# Patient Record
Sex: Female | Born: 1985
Health system: Southern US, Community
[De-identification: ages and names within clinical notes are randomized; demographics above are authoritative.]

## PROBLEM LIST (undated history)

## (undated) DIAGNOSIS — Z8742 Personal history of other diseases of the female genital tract: Secondary | ICD-10-CM

## (undated) DIAGNOSIS — B977 Papillomavirus as the cause of diseases classified elsewhere: Secondary | ICD-10-CM

## (undated) DIAGNOSIS — Z8619 Personal history of other infectious and parasitic diseases: Secondary | ICD-10-CM

## (undated) DIAGNOSIS — Z87891 Personal history of nicotine dependence: Secondary | ICD-10-CM

## (undated) DIAGNOSIS — D069 Carcinoma in situ of cervix, unspecified: Secondary | ICD-10-CM

---
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
## 2007-02-25 DIAGNOSIS — G43909 Migraine, unspecified, not intractable, without status migrainosus: Secondary | ICD-10-CM | POA: Insufficient documentation

## 2007-05-30 DIAGNOSIS — Z8619 Personal history of other infectious and parasitic diseases: Secondary | ICD-10-CM

## 2007-05-30 DIAGNOSIS — Z8742 Personal history of other diseases of the female genital tract: Secondary | ICD-10-CM

## 2007-05-30 HISTORY — DX: Personal history of other diseases of the female genital tract: Z87.42

## 2007-05-30 HISTORY — DX: Personal history of other infectious and parasitic diseases: Z86.19

## 2007-06-03 ENCOUNTER — Telehealth: Payer: Self-pay | Admitting: *Deleted

## 2007-06-04 ENCOUNTER — Ambulatory Visit: Payer: Self-pay | Admitting: Family Medicine

## 2007-06-04 LAB — CONVERTED CEMR LAB
Bilirubin Urine: NEGATIVE
Glucose, Urine, Semiquant: NEGATIVE
MCV: 78.2 fL
Platelets: 252 10*3/uL
Protein, U semiquant: 30
Specific Gravity, Urine: 1.025
pH: 6

## 2007-06-10 ENCOUNTER — Ambulatory Visit: Payer: Self-pay | Admitting: Family Medicine

## 2007-06-10 ENCOUNTER — Inpatient Hospital Stay (HOSPITAL_COMMUNITY): Admission: EM | Admit: 2007-06-10 | Discharge: 2007-06-13 | Payer: Self-pay | Admitting: Emergency Medicine

## 2007-06-14 ENCOUNTER — Encounter: Payer: Self-pay | Admitting: Family Medicine

## 2007-06-17 ENCOUNTER — Encounter: Admission: RE | Admit: 2007-06-17 | Discharge: 2007-06-17 | Payer: Self-pay | Admitting: Interventional Radiology

## 2007-06-28 ENCOUNTER — Encounter: Payer: Self-pay | Admitting: Family Medicine

## 2007-06-28 ENCOUNTER — Ambulatory Visit: Payer: Self-pay | Admitting: Family Medicine

## 2007-06-28 DIAGNOSIS — N760 Acute vaginitis: Secondary | ICD-10-CM | POA: Insufficient documentation

## 2007-06-28 DIAGNOSIS — N739 Female pelvic inflammatory disease, unspecified: Secondary | ICD-10-CM | POA: Insufficient documentation

## 2007-06-28 LAB — CONVERTED CEMR LAB
Chlamydia, DNA Probe: NEGATIVE
Whiff Test: NEGATIVE

## 2007-07-01 ENCOUNTER — Telehealth: Payer: Self-pay | Admitting: *Deleted

## 2007-07-05 ENCOUNTER — Encounter: Payer: Self-pay | Admitting: Family Medicine

## 2008-11-01 ENCOUNTER — Other Ambulatory Visit: Admission: RE | Admit: 2008-11-01 | Discharge: 2008-11-01 | Payer: Self-pay | Admitting: Family Medicine

## 2008-11-01 ENCOUNTER — Ambulatory Visit: Payer: Self-pay | Admitting: Family Medicine

## 2008-11-01 ENCOUNTER — Encounter: Payer: Self-pay | Admitting: Family Medicine

## 2008-11-01 DIAGNOSIS — F32A Depression, unspecified: Secondary | ICD-10-CM | POA: Insufficient documentation

## 2008-11-01 DIAGNOSIS — F329 Major depressive disorder, single episode, unspecified: Secondary | ICD-10-CM

## 2008-11-02 LAB — CONVERTED CEMR LAB
Cholesterol: 120 mg/dL (ref 0–200)
GC Probe Amp, Genital: NEGATIVE
HDL: 56 mg/dL (ref >39)
Total CHOL/HDL Ratio: 2.1
VLDL: 15 mg/dL (ref 0–40)

## 2008-11-21 ENCOUNTER — Ambulatory Visit: Payer: Self-pay | Admitting: Family Medicine

## 2008-11-21 ENCOUNTER — Encounter: Payer: Self-pay | Admitting: Family Medicine

## 2008-12-21 IMAGING — US US ABDOMEN COMPLETE
1 series · 14 of 25 positions shown · non-contrast
Comparison: none

CLINICAL DATA: Stomach pain

ABDOMEN ULTRASOUND:
TECHNIQUE: Complete abdominal ultrasound examination was performed including
evaluation of the liver, gallbladder, bile ducts, pancreas, kidneys, spleen,
IVC, and abdominal aorta.

[Series 1: unknown · 0.34mm/px · 14 of 52 slices shown]
[im 1/52]
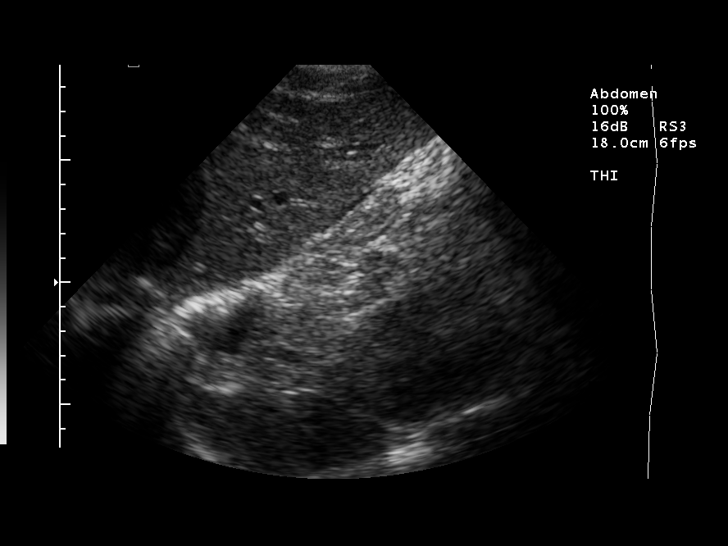
[im 5/52]
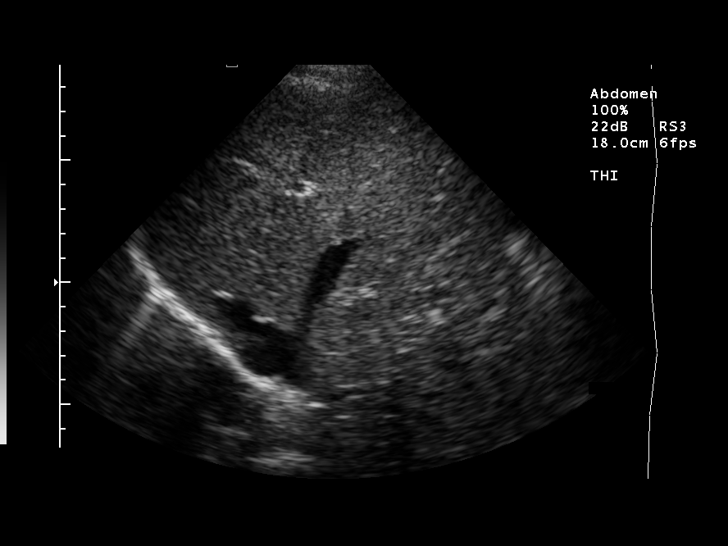
[im 9/52]
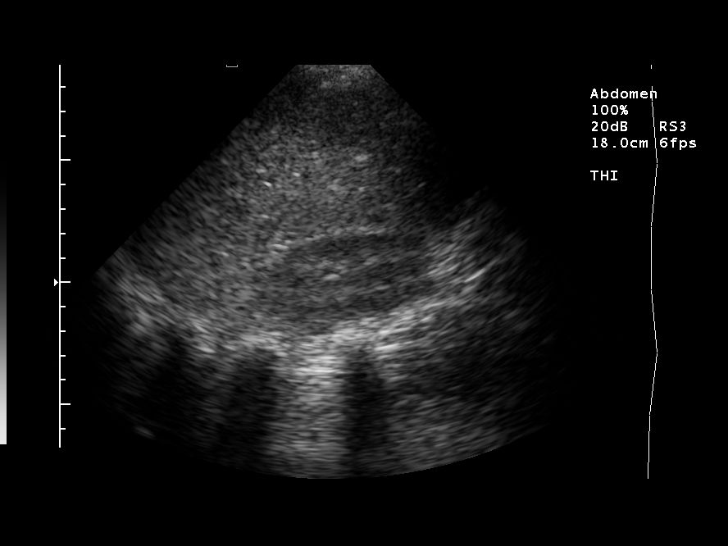
[im 13/52]
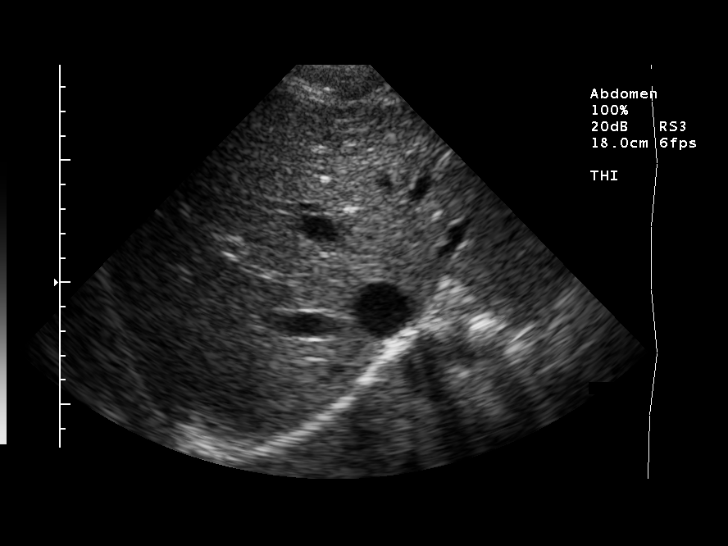
[im 18/52]
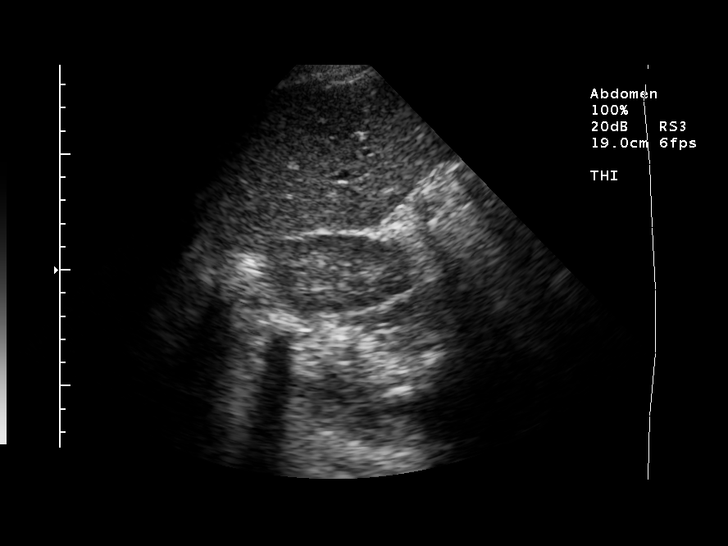
[im 20/52]
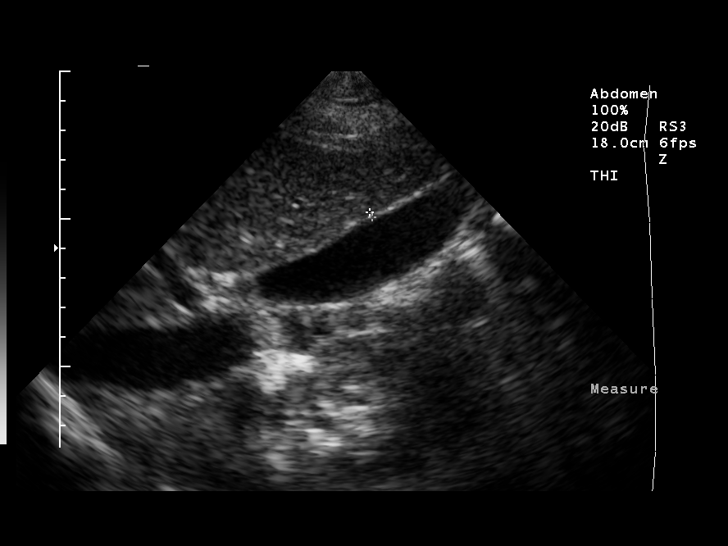
[im 24/52]
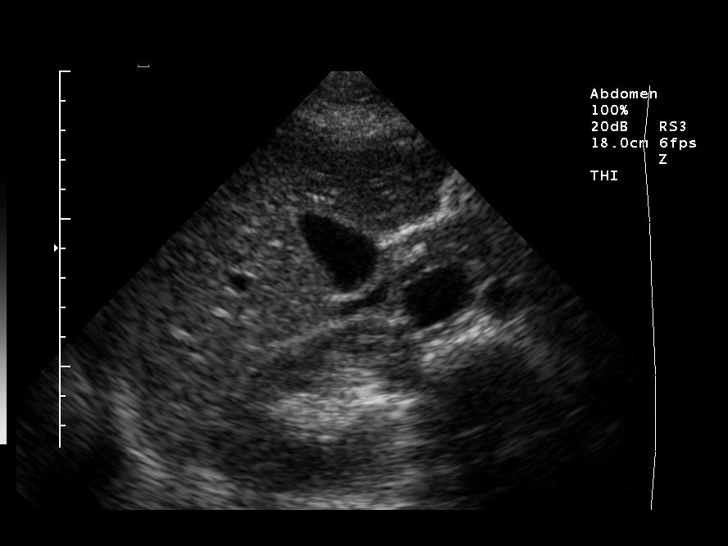
[im 28/52]
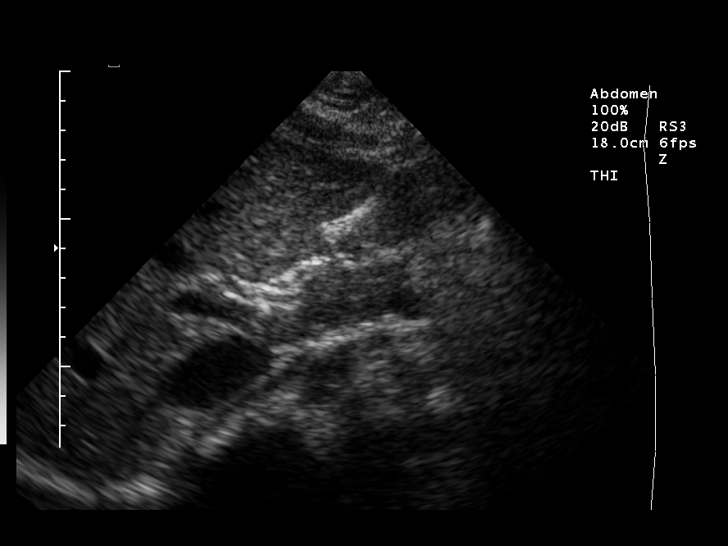
[im 32/52]
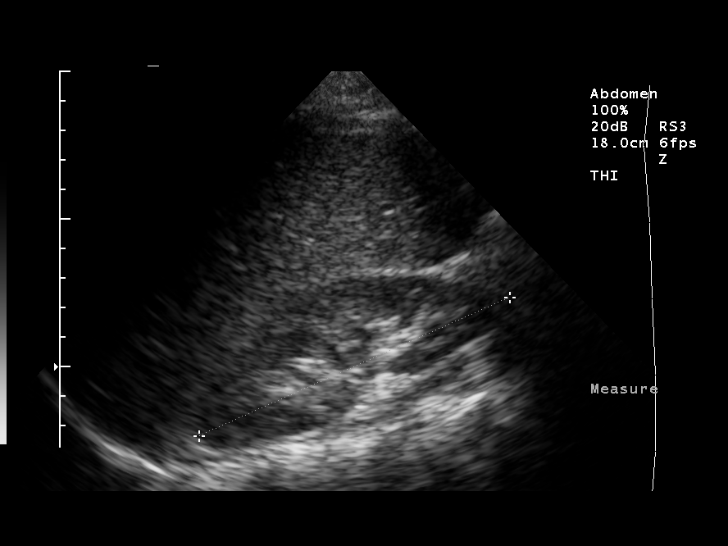
[im 35/52]
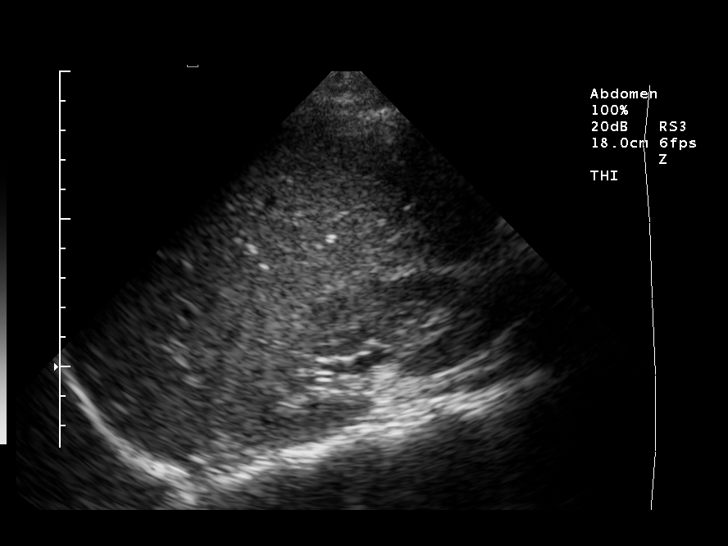
[im 39/52]
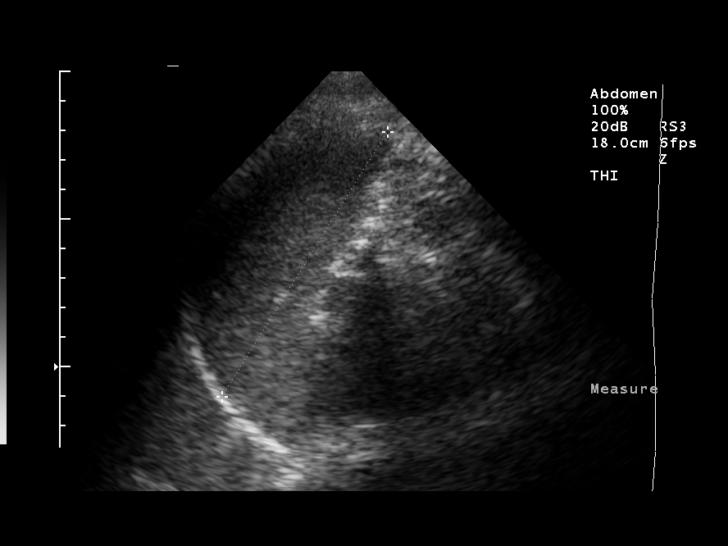
[im 43/52]
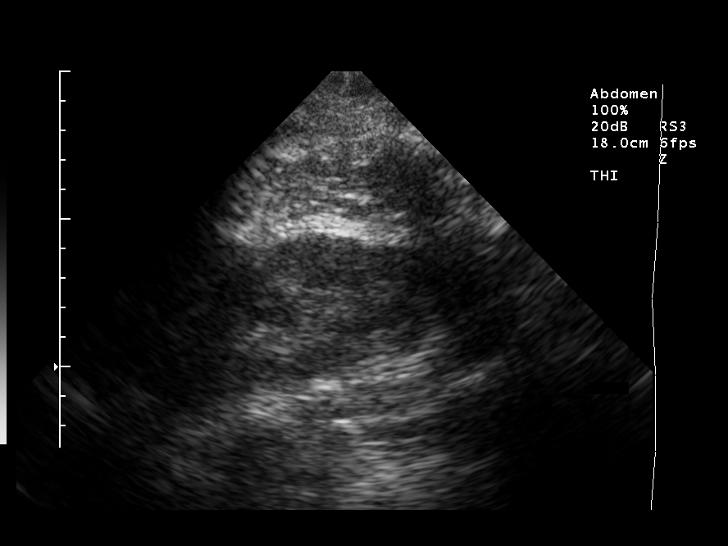
[im 47/52]
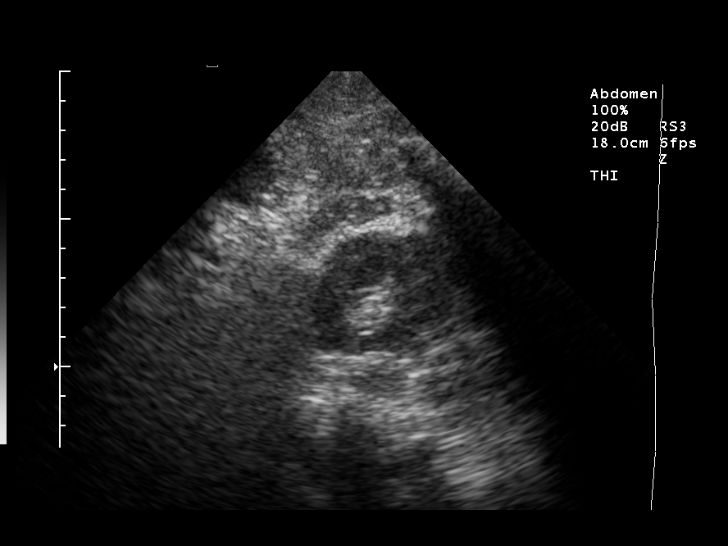
[im 52/52]
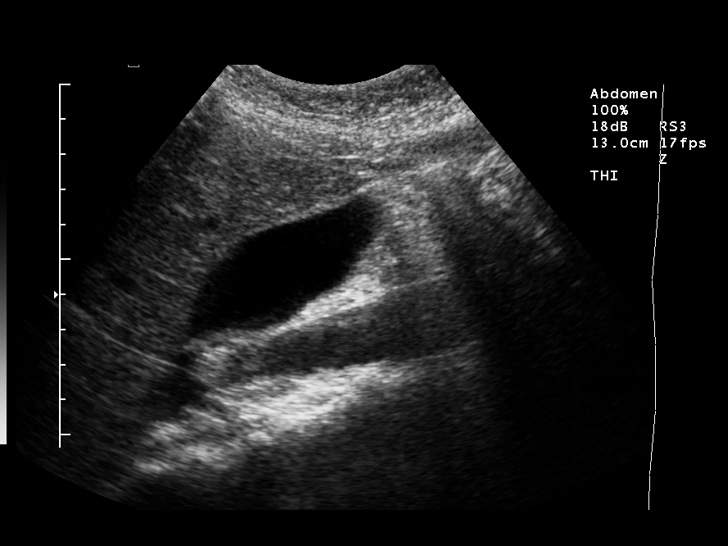

[14 of 25 positions shown; findings below may reference images not displayed]

FINDINGS: There is no evidence for gallstones or biliary ductal dilatation. 

The liver is enlarged measuring 17.07 meters. There is increased echogenicity
suggesting fatty infiltration or 

The visualized portions of the IVC and pancreas are unremarkable . 

There is no evidence for splenomegaly 

The kidneys are unremarkable, and there is no evidence of hydronephrosis.  

The abdominal aorta is nondilated.
IMPRESSION: 1. No acute findings.
2. Echogenic liver which is enlarged suggest fatty infiltration

## 2009-05-18 ENCOUNTER — Ambulatory Visit: Payer: Self-pay | Admitting: Family Medicine

## 2009-05-18 DIAGNOSIS — IMO0001 Reserved for inherently not codable concepts without codable children: Secondary | ICD-10-CM | POA: Insufficient documentation

## 2009-05-21 LAB — CONVERTED CEMR LAB
AST: 20 units/L (ref 0–37)
Albumin: 4.2 g/dL (ref 3.5–5.2)
BUN: 17 mg/dL (ref 6–23)
Calcium: 9.5 mg/dL (ref 8.4–10.5)
Chloride: 106 meq/L (ref 96–112)
Potassium: 3.4 meq/L — ABNORMAL LOW (ref 3.5–5.3)
Sodium: 142 meq/L (ref 135–145)
Total Protein: 7.3 g/dL (ref 6.0–8.3)
Vit D, 25-Hydroxy: 26 ng/mL — ABNORMAL LOW (ref 30–89)

## 2009-05-24 ENCOUNTER — Emergency Department (HOSPITAL_COMMUNITY): Admission: EM | Admit: 2009-05-24 | Discharge: 2009-05-24 | Payer: Self-pay | Admitting: Emergency Medicine

## 2009-06-11 ENCOUNTER — Ambulatory Visit: Payer: Self-pay | Admitting: Family Medicine

## 2009-06-11 ENCOUNTER — Encounter: Payer: Self-pay | Admitting: Family Medicine

## 2009-06-19 ENCOUNTER — Emergency Department (HOSPITAL_COMMUNITY): Admission: EM | Admit: 2009-06-19 | Discharge: 2009-06-19 | Payer: Self-pay | Admitting: Emergency Medicine

## 2009-09-15 ENCOUNTER — Encounter (INDEPENDENT_AMBULATORY_CARE_PROVIDER_SITE_OTHER): Payer: Self-pay | Admitting: *Deleted

## 2009-09-15 DIAGNOSIS — Z87891 Personal history of nicotine dependence: Secondary | ICD-10-CM | POA: Insufficient documentation

## 2009-09-15 HISTORY — DX: Personal history of nicotine dependence: Z87.891

## 2010-04-03 ENCOUNTER — Other Ambulatory Visit: Admission: RE | Admit: 2010-04-03 | Discharge: 2010-04-03 | Payer: Self-pay | Admitting: Family Medicine

## 2010-04-03 ENCOUNTER — Ambulatory Visit: Payer: Self-pay | Admitting: Family Medicine

## 2010-04-03 LAB — CONVERTED CEMR LAB
Chlamydia, DNA Probe: NEGATIVE
GC Probe Amp, Genital: NEGATIVE
Whiff Test: NEGATIVE

## 2010-04-05 ENCOUNTER — Telehealth: Payer: Self-pay | Admitting: Family Medicine

## 2010-04-15 ENCOUNTER — Encounter: Payer: Self-pay | Admitting: Family Medicine

## 2010-04-25 ENCOUNTER — Ambulatory Visit: Payer: Self-pay | Admitting: Family Medicine

## 2010-04-25 ENCOUNTER — Encounter (INDEPENDENT_AMBULATORY_CARE_PROVIDER_SITE_OTHER): Payer: Self-pay | Admitting: *Deleted

## 2010-04-25 DIAGNOSIS — R8789 Other abnormal findings in specimens from female genital organs: Secondary | ICD-10-CM

## 2010-04-25 DIAGNOSIS — Z8742 Personal history of other diseases of the female genital tract: Secondary | ICD-10-CM | POA: Insufficient documentation

## 2010-04-26 ENCOUNTER — Encounter: Payer: Self-pay | Admitting: Family Medicine

## 2010-05-13 ENCOUNTER — Telehealth: Payer: Self-pay | Admitting: Family Medicine

## 2010-05-13 ENCOUNTER — Ambulatory Visit: Payer: Self-pay | Admitting: Family Medicine

## 2010-05-13 DIAGNOSIS — N898 Other specified noninflammatory disorders of vagina: Secondary | ICD-10-CM | POA: Insufficient documentation

## 2011-01-28 NOTE — Progress Notes (Signed)
Summary: triage  Phone Note Call from Patient Call back at Home Phone [phone]   Caller: Patient Summary of Call: Having a discharge after having a bx on around the 25th of April. Initial call taken by: Clydell Hakim,  May 13, 2010 8:38 AM  Follow-up for Phone Call        states she did not bleed after the colposcopy but is now having a black discharge. wants to be seen. appt at 4 as she cannot get off work earlier. aware she will not be seeing her pcp Follow-up by: Golden Circle RN,  May 13, 2010 8:50 AM  Additional Follow-up for Phone Call Additional follow up Details #1::        noted and agree. Additional Follow-up by: Doralee Albino MD,  May 13, 2010 9:24 AM

## 2011-01-28 NOTE — Miscellaneous (Signed)
Summary: Consent for colposcopy  Consent for colposcopy   Imported By: Knox Royalty 06/06/2010 10:05:11  _____________________________________________________________________  External Attachment:    Type:   Image     Comment:   External Document

## 2011-01-28 NOTE — Assessment & Plan Note (Signed)
Summary: birth control & std ck,df   Vital Signs:  Patient profile:   25 year old female Height:      65.25 inches Weight:      200.8 pounds BMI:     33.28 Temp:     97.7 degrees F oral Pulse rate:   78 / minute BP sitting:   116 / 78  (left arm) Cuff size:   large  Vitals Entered By: Gladstone Pih (April 03, 2010 4:11 PM) CC: PAP, STD check Is Patient Diabetic? No Pain Assessment Patient in pain? no        CC:  PAP and STD check.  History of Present Illness: Here for regular check up, pap and STD check. Sad over recent loss of mom who died January 21, 2010  Not taking fluoxetine for some time.  Felt she did not need it.  When she did take it, the med helped.  Habits & Providers  Alcohol-Tobacco-Diet     Alcohol drinks/day: <1     Tobacco Status: current     Tobacco Counseling: to quit use of tobacco products     Cigarette Packs/Day: <0.25     Diet Comments: Eats junk food.    Exercise-Depression-Behavior     Does Patient Exercise: yes     Exercise Counseling: to improve exercise regimen     Type of exercise: treadmill     Exercise (avg: min/session): >60     Times/week: <3     Have you felt down or hopeless? yes     Have you felt little pleasure in things? yes     Depression Counseling: not indicated; screening negative for depression     STD Risk: past  Current Medications (verified): 1)  None  Allergies (verified): No Known Drug Allergies  Past History:  Past medical, surgical, family and social histories (including risk factors) reviewed, and no changes noted (except as noted below).  Family History: Reviewed history from 02/25/2007 and no changes required. lupus  Social History: Reviewed history from 02/25/2007 and no changes required. has a one year old - 3/07; lives with son; High school drop out.  Works with mentally ill and disabled. Packs/Day:  <0.25 STD Risk:  past Does Patient Exercise:  yes  Review of Systems  The patient denies anorexia,  chest pain, dyspnea on exertion, abdominal pain, abnormal bleeding, and breast masses.    Physical Exam  General:  Well-developed,well-nourished,in no acute distress; alert,appropriate and cooperative throughout examination Neck:  No deformities, masses, or tenderness noted. Lungs:  Normal respiratory effort, chest expands symmetrically. Lungs are clear to auscultation, no crackles or wheezes. Heart:  Normal rate and regular rhythm. S1 and S2 normal without gallop, murmur, click, rub or other extra sounds. Abdomen:  Bowel sounds positive,abdomen soft and non-tender without masses, organomegaly or hernias noted. Genitalia:  Normal introitus for age, no external lesions, no vaginal discharge, mucosa pink and moist, no vaginal or cervical lesions, no vaginal atrophy, no friaility or hemorrhage, normal uterus size and position, no adnexal masses or tenderness   Impression & Recommendations:  Problem # 1:  SCREENING FOR MALIGNANT NEOPLASM OF THE CERVIX (ICD-V76.2)  Orders: [facility] - Est  18-39 yrs ([address])  Problem # 2:  SEXUALLY TRANSMITTED DISEASE, EXPOSURE TO (ICD-V01.6)  Orders: HIV-[facility] (60454-09811) RPR-[facility] ([id number]) GC/Chlamydia-[facility] (87591/87491) Wet Prep- [facility] ([address]86)  Other Orders: U Preg-[facility] ([address])  Patient Instructions: 1)  I will call with lab and pap results.    Laboratory Results   Urine Tests  Date/Time  Received: April 03, 2010 4:37 PM  Date/Time Reported: April 03, 2010 4:52 PM     Urine HCG: negative Comments: ...............test performed by......Marland KitchenBonnie A. [place], MLS (ASCP)cm  Date/Time Received: April 03, 2010 4:37 PM  Date/Time Reported: April 03, 2010 4:53 PM   Wet [place] Source: vag WBC/hpf: 1-5 Bacteria/hpf: 2+  Rods Clue cells/hpf: none  Negative whiff Yeast/hpf: none Trichomonas/hpf: none Comments: ...............test performed by......Marland KitchenBonnie A. [place], MLS (ASCP)cm    Appended Document: birth control & std  ck,df     Allergies: No Known Drug Allergies   Other Orders: [facility] - Est  18-39 yrs ([address]) Pap Smear- [facility] (Pap)

## 2011-01-28 NOTE — Letter (Signed)
Summary: [facility]  [address]   [place], [place] [address]   Phone: [phone]  Fax: [phone]    04/26/2010  [facility] [address] [place], [place]  [address]  Dear Ms. Sweitzer,  As we discussed on the phone today, your biopsy confirmed the mild abnormalitiy we have been seeing on your pap smears. At your current age with these results, I would recommend a pap smear in one year. If that is abnormal, we will need to repeat the colposopy.  Do not skip a year as we do need to follow this yearly for a while.         Sincerely,   Denny Levy MD  Appended Document: COLPOSCOPYLetter mailed.

## 2011-01-28 NOTE — Letter (Signed)
Summary: Out of Work  [facility]  [address]   [place], [place] [address]   Phone: [phone]  Fax: [phone]    April 25, 2010   Employee:  [name]    To Whom It May Concern:   For Medical reasons, please excuse the above named employee from work for the following dates:  Start:   April 25, 2010  End:  April 25, 2010  If you need additional information, please feel free to contact our office.         Sincerely,    Gladstone Pih

## 2011-01-28 NOTE — Progress Notes (Signed)
Summary: Test Res  Phone Note Call from Patient Call back at Home Phone [phone]   Caller: Patient Summary of Call: Checking on test results from this week. Initial call taken by: Clydell Hakim,  April 05, 2010 1:47 PM  Follow-up for Phone Call        to PCP Follow-up by: Gladstone Pih,  April 05, 2010 1:56 PM  Additional Follow-up for Phone Call Additional follow up Details #1::        Called and informed tests showed no STDs Additional Follow-up by: Doralee Albino MD,  April 08, 2010 8:46 AM

## 2011-01-28 NOTE — Assessment & Plan Note (Signed)
Summary: colpo,tcb   Vital Signs:  Patient profile:   25 year old female Height:      65.25 inches Weight:      200.2 pounds BMI:     33.18 Temp:     98.1 degrees F oral Pulse rate:   68 / minute BP sitting:   107 / 69  (right arm) Cuff size:   large  Vitals Entered By: Gladstone Pih (April 25, 2010 8:43 AM) CC: Colpo Is Patient Diabetic? No Pain Assessment Patient in pain? no        CC:  Colpo.  History of Present Illness: LGSIL at 34 yoa with normal colpo epeat pap this year also showed LGSIl' asymptomnatic  Habits & Providers  Alcohol-Tobacco-Diet     Tobacco Status: current     Tobacco Counseling: to quit use of tobacco products     Cigarette Packs/Day: <0.25  Allergies: No Known Drug Allergies  Physical Exam  Genitalia:  normal introitus, no external lesions, no vaginal discharge, and mucosa pink and moist.   Additional Exam:  Patient given informed consent, signed copy in the chart. Placed in lithotomy position. Cervix viewed with speculum and colposcope. Was the entire squamocolumnar junction seen?yes Any acetowhite lesions noted?yes--acetowhite circumferential around os covering much of the transformation zone Any abnormalities seen with green filter?no Any abnormalities seen with application of Lugol's solution?no Was the endocervical canal sampled?no. the entire extent of the acetowhite was not seen as it extended into canal abd appeared to be same lesion so we did not sample the canal as we did biopsy Were any cervical biopsies taken?yes 8:00 Were there any complications?no COMMENTS: Patient was given post procedure instructions. We will notify her of any results.    Impression & Recommendations:  Problem # 1:  ABNORMAL PAP SMEAR, LGSIL (ICD-795.09)  Orders: Colposcopy w/ biopsy [facility] ([address]) if biopsy negative, would repeat pap in one year and continue q year pas until 3 normal yearly paps before considering change to greater screening  interval  Appended Document: colpo,tcb   Medication Administration  Medication # 1:    Medication: Ibuprofen 200mg  tab    Diagnosis: ABNORMAL PAP SMEAR, LGSIL (ICD-795.09)    Dose: 2 tablets    Route: po    Exp Date: 09/29/2011    Lot #: [id number]    Mfr: Major    Comments: Per Dr Jennette Kettle 800mg  admin    Patient tolerated medication without complications    Given by: Gladstone Pih (April 25, 2010 9:16 AM)  Medication # 2:    Medication: Ibuprofen 200mg  tab    Diagnosis: ABNORMAL PAP SMEAR, LGSIL (ICD-795.09)    Dose: 2 tablets    Route: po    Exp Date: 09/29/2011    Lot #: [id number]    Mfr: Major    Patient tolerated medication without complications    Given by: Gladstone Pih (April 25, 2010 9:16 AM)  Orders Added: 1)  Ibuprofen 200mg  tab [EMRORAL] 2)  Ibuprofen 200mg  tab [EMRORAL]

## 2011-01-28 NOTE — Assessment & Plan Note (Signed)
Summary: vag d/c/Lomas/hensel   Vital Signs:  Patient profile:   25 year old female Weight:      200 pounds Pulse rate:   83 / minute BP sitting:   117 / 73  (left arm)  Vitals Entered By: Renato Battles slade,cma CC: vag d/c x > 2 weeks after cervical biopsy. some sharp pain off and on since colpo. Is Patient Diabetic? No Pain Assessment Patient in pain? no        CC:  vag d/c x > 2 weeks after cervical biopsy. some sharp pain off and on since colpo.Marland Kitchen  History of Present Illness: CC: vag d/c  colposcopy 2 wks ago, told would have d/c for 2 wks and continuing.  Dark brown - like blood.  + odor.  No itching.  No burning, no dysuria.  No fevers/chills.  + abd pain - sharp epigastric this weekend.  using pads, goes through 3 a day.  Normally period heavy, lasts 7 days.    LMP mid April.  Sexually active, last 1 wk ago.  No contraception, + condoms.  Habits & Providers  Alcohol-Tobacco-Diet     Tobacco Status: current     Tobacco Counseling: to quit use of tobacco products  Current Medications (verified): 1)  None  Allergies (verified): No Known Drug Allergies  Past History:  Past Medical History: colposcopy 04/2010 - pending  Physical Exam  General:  Well-developed,well-nourished,in no acute distress; alert,appropriate and cooperative throughout examination Genitalia:  Normal introitus for age, no external lesions, mucosa pink and moist, no vaginal or cervical lesions, no vaginal atrophy, no friaility or hemorrhage, + black particulate discharge in clumps, one larger than rest, able to be removed.  ? Monsel's solution residual.   Impression & Recommendations:  Problem # 1:  LEUKORRHEA (ICD-623.5)  s/p colposcopy 4/28.  likely particulate discharge from dissolving monsel's clump.  advised to give 5 more days, if continued discharge, to return for f/u.   Orders: [facility]- Est Level  3 ([id number])

## 2011-02-21 ENCOUNTER — Inpatient Hospital Stay (INDEPENDENT_AMBULATORY_CARE_PROVIDER_SITE_OTHER)
Admission: RE | Admit: 2011-02-21 | Discharge: 2011-02-21 | Disposition: A | Discharge status: Home / Self Care | Payer: Medicaid Other | Source: Ambulatory Visit | Attending: Family Medicine | Admitting: Family Medicine

## 2011-02-21 DIAGNOSIS — H60399 Other infective otitis externa, unspecified ear: Secondary | ICD-10-CM

## 2011-04-07 LAB — POCT I-STAT, CHEM 8
Creatinine, Ser: 0.7 mg/dL (ref 0.4–1.2)
Glucose, Bld: 79 mg/dL (ref 70–99)
HCT: 34 % — ABNORMAL LOW (ref 36.0–46.0)
Sodium: 140 mEq/L (ref 135–145)

## 2011-04-18 ENCOUNTER — Emergency Department (HOSPITAL_COMMUNITY): Payer: Medicaid Other

## 2011-04-18 ENCOUNTER — Emergency Department (HOSPITAL_COMMUNITY)
Admission: EM | Admit: 2011-04-18 | Discharge: 2011-04-18 | Disposition: A | Discharge status: Home / Self Care | Payer: Medicaid Other | Attending: Emergency Medicine | Admitting: Emergency Medicine

## 2011-04-18 DIAGNOSIS — S6990XA Unspecified injury of unspecified wrist, hand and finger(s), initial encounter: Secondary | ICD-10-CM | POA: Insufficient documentation

## 2011-04-18 DIAGNOSIS — S6980XA Other specified injuries of unspecified wrist, hand and finger(s), initial encounter: Secondary | ICD-10-CM | POA: Insufficient documentation

## 2011-04-18 DIAGNOSIS — M79609 Pain in unspecified limb: Secondary | ICD-10-CM | POA: Insufficient documentation

## 2011-04-18 DIAGNOSIS — Y929 Unspecified place or not applicable: Secondary | ICD-10-CM | POA: Insufficient documentation

## 2011-04-18 DIAGNOSIS — F17200 Nicotine dependence, unspecified, uncomplicated: Secondary | ICD-10-CM | POA: Insufficient documentation

## 2011-05-13 NOTE — Discharge Summary (Signed)
[name], [name]              ACCOUNT NO.:  [id number]   MEDICAL RECORD NO.:  [id number]          PATIENT TYPE:  INP   LOCATION:  5713                         FACILITY:  [facility]   PHYSICIAN:  Levander Campion, M.D.  DATE OF BIRTH:  1986-06-24   DATE OF ADMISSION:  06/09/2007  DATE OF DISCHARGE:  06/13/2007                               DISCHARGE SUMMARY   PRIMARY CARE PHYSICIAN:  Dr. Leveda Ford at [facility]   DISCHARGE DIAGNOSES:  1. Tubo-ovarian abscess.  2. Anemia.  3. Chlamydia.  4. Migraine.   DISCHARGE MEDICATIONS:  1. Doxycycline 100 mg twice daily for total of a 14-day course.  2. Iron sulfate 325 mg 1 tablet twice daily, to start after      antibiotics are finished on June 25.   CONSULTS:  Interventional Radiology.   PROCEDURES:  1. A CT of the pelvis on June 10, 2007 showed:  1. Diffuse periportal      edema which is a nonspecific finding, 2. Fatty infiltration of the      liver, 3. Pericholecystic fluid in the pelvis.  1. A complex cystic      mass within the pelvis in the setting of abdominal pain and fever.      This was concerning for a tubo-ovarian abscess.  2. A transvaginal ultrasound on June 10, 2007 showed a complex cyst or      cysts associated with the right ovary that were consistent with a      collapsing hemorrhagic or an infected cyst.  3. On June 11, 2007, the patient underwent CT-guided I&D of tubo-      ovarian abscess by Interventional Radiology.   LABORATORY DATA:  Admission labs:  White blood count 8.7, hemoglobin  8.4, hematocrit 26.3, MCV is 78.4, platelet count 365, ANC of 6.7.  CMP:  Sodium 140, potassium 3.4, chloride 105, bicarb 29, glucose 86, BUN 10,  creatinine 0.62, total bilirubin 0.5, alkaline phosphatase 112, AST 32,  ALT 32, total protein 7.4, albumin 3.3, calcium 9.2.  Amylase 49, lipase  18.  Urinalysis positive for small bilirubin, otherwise negative.  Wet  prep positive for a few clue cells.   Patient's fecal occult blood  negative.  She had an INR of 1.2, a PTT of 40.  A repeat hemoglobin was  6.9 and hematocrit 20.6.   Discharge laboratories:  Lipid profile:  Cholesterol 87, triglycerides  41, HDL 30, LDL 49.  RPR was nonreactive.  Chlamydia was positive,  gonorrhea was negative.  HIV was nonreactive.  Reticulocyte count was  1.5%.  Acute hepatitis panel was negative for hepatitis B and negative  for hepatitis C.  Iron was 14, total iron binding capacity was 289,  percent saturation was 5.  Folate was 10.3, vitamin B12 was 910.  Pregnancy test was negative.  Discharge chemistries were within normal  limits.  Discharge CBC included a white blood count of 4.3, hemoglobin  of 7.1, hematocrit of 21.4 with an MCV of 77.0 and a platelet count of  342.  Culture of tubo-ovarian abscess showed no growth x3 days, final.  Blood culture was no growth x5 days, final x2.   HOSPITAL COURSE:  [name] is a 25 year old G1, P82 African-American  female who presented with a 1-week history of right upper quadrant  abdominal pain.  She also complained of dizziness but denied nausea or  vomiting.  She had just finished her menstrual period which had lasted  for 10 days instead of 7.  Her abdominal pain was rated a 10/10.  She  does endorse having a fever recently.  She denied dysuria, vaginal  discharge, new sexual partners.   By problem:  1. Abdominal pain.  Patient's abdominal pain initially had a brought      differential diagnosis, including gallbladder disease; however,      this was normal on ultrasound and LFTs were within normal limits,      pancreatitis, although amylase and lipase were normal.  Pelvic      inflammatory disease was on the differential; however, the patient      had no cervical motion tenderness and only clue cells suggestive of      bacterial vaginosis on wet prep.  Upon obtaining a CT scan of the      belly, a cystic mass associated with the right ovary was found,       concerning in the setting of fever and elevated white blood count      to be a tubo-ovarian abscess.  A transvaginal ultrasound was      consistent with CT scan findings.  Chlamydia did come back      positive.  Urine pregnancy test was negative.  The patient was      treated with cefotetan, Flagyl, and doxycycline for presumed tubo-      ovarian abscess.  The patient's pain did improve, and she remained      afebrile with a decreasing white count throughout her hospital      stay.  Interventional Radiology was consulted and the tubo-ovarian      abscess was drained successfully.  The patient is to finish a 14-      day course of doxycycline for her TOA and chlamydia.  Gonorrhea,      HIV, and RPR were all negative.  2. Anemia.  The patient had a significant drop in her baseline      hemoglobin to 6.9 at the time of admission.  Patient is anemic at      baseline with a hemoglobin of 8.4.  It is a microcytic anemia, and      this is most likely secondary to iron-deficiency anemia secondary      to menorrhagia.  Orthostatics were negative, and the patient      remained asymptomatic with the anemia throughout the hospital stay.      Iron studies was performed and the patient was found to be iron      deficient, and she was started on iron at the time of discharge to      be started after finishing the course of her antibiotics for tubo-      ovarian abscess.   FOLLOWUP:  The patient is to call to schedule a follow-up CT with  [facility] with Interventional Radiology once the patient's  drain output was less than 15 mL daily.  The patient is also to follow  up with Dr. Leveda Ford at the [facility] on July 05, 2007 at 2:15  p.m.           ______________________________  Deanna Bumps  Luz Ford, M.D.     JH/MEDQ  D:  06/17/2007  T:  06/18/2007  Job:  [id number]

## 2011-05-13 NOTE — H&P (Signed)
[name], [name]              ACCOUNT NO.:  [id number]   MEDICAL RECORD NO.:  [id number]          PATIENT TYPE:  EMS   LOCATION:  MAJO                         FACILITY:  [facility]   PHYSICIAN:  Leighton Roach McDiarmid, M.D.DATE OF BIRTH:  15-Jun-1986   DATE OF ADMISSION:  06/09/2007  DATE OF DISCHARGE:                              HISTORY & PHYSICAL   CHIEF COMPLAINT:  Abdominal pain.   HISTORY OF PRESENT ILLNESS:  A 25 year old, G1, P72, African American  female presents with a 1 week history of abdominal pain worse in the  right upper quadrant with concomitant bilateral trapezius pain.  Patient  does have dizziness, but denies any nausea or vomiting.  She has been on  her period, it started at the regular time, but had lasted for 10 days  instead of the usual 7 days.  The pain is about a 10/10 today.  She was  seen at [facility] for the same problem last Friday, but  it has worsened significantly since then.  At that time, she was  diagnosed with menstrual cramps and given ibuprofen/OCPs.  She has  progressively worsened since that time.  Her current pain did not  respond to the Tylenol/ibuprofen.  Since her last visit, her weight is  down about 3 pounds and her hemoglobin has dropped from 9.6 to 8.4.  She  had a history of low transverse C-section for her first and only child.  She is sexually active with no new partners.  She has had a fever  recently.  She has had no bowel movement x3 days, which is abnormal for  her.  She denies any dysuria/hematemesis/vaginal  discharge/dyspnea/numbness/weakness.   REVIEW OF SYSTEMS:  See HPI; otherwise, normal.   PAST MEDICAL HISTORY:  1. Migraines.  2. G1, P1.  3. Status post C-section.   ALLERGIES:  NO KNOWN DRUG ALLERGIES.   FAMILY HISTORY:  Lupus.   MEDICATIONS:  Seasonique, ibuprofen/Tylenol.   SOCIAL HISTORY:  Patient has a 22-year-old child.  She lives with her  mom.  She denies any alcohol use.  She did not finish  high school.   GENERAL:  Appears in moderate pain, well-developed, well-nourished  African American female.  HEENT:  Shows externally eyes, ears and mouth.  No thyromegaly/  lymphadenopathy of that region.  CARDIOVASCULAR:  Regular rate and rhythm with no murmurs.  PULMONARY:  Clear to auscultation bilaterally.  ABDOMEN:  Diffuse tenderness, especially in the right upper quadrant  with no hepatosplenomegaly.  Negative for psoas/obturator signs.  SKIN:  Shows no rash.  GU:  Shows normal external genitalia.  No cervical motion tenderness.  Positive yellow discharge.  Normal appearing cervix.  Normal palpation  bimanual exam.  EXTREMITIES:  Show no edema/cyanosis.  BACK:  Shows no CVA tenderness.  RECTAL:  Normal with a small amount of firm stool palpated in the rectum  and guaiac negative.   Wet prep shows negative yeast, negative trich, a few clue cells and a  few white blood cells.  Urinalysis shows some bilirubin with a specific  gravity of 1.037.  Urine pregnancy is negative.  ANC is 6.7, hemoglobin  is 8.4, white count is 8.7, platelets are 365, MCV 78.4, RDW is 14.7,  lipase is 18, amylase is 49.  Urine pregnancy negative.  Sodium 140,  potassium 3.4, chloride 105, bicarb 29, BUN 10, creatinine 0.62, glucose  86, total bili 0.5, alkaline phosphatase 112, calcium 9.2, AST 32, ALT  32, total protein 7.4, albumin 3.3.  Abdominal ultrasound shows a large  echogenic liver with question of marked fatty infiltrate.   ASSESSMENT/PLAN:  A 25 year old African American female with right upper  quadrant abdominal pain and a low grade fever.   1. Abdominal pain:  This is an unknown etiology accompanied with a      concerning physical exam, although it has improved after the      administration of morphine.  The pain is located over the liver in      the right upper quadrant mainly, but the gallbladder was normal on      ultrasound and amylase and lipase were also normal.  I am not aware       of fatty liver causing the kind of pain that she is experiencing,      especially with fever.  Although his was found on exam.      Clinically, this is not pelvic inflammatory disease with no      cervical motion tenderness and only clue cells on wet prep.  She is      fecal occult blood negative.  Constipation is certainly a      possibility, but I would not expect fever with that.  Small bowel      obstruction could present in this manner, especially with her      history of C-section and no bowel movement x3 days, although the      location of the pain is not necessarily the most common      presentation.  Appendicitis is also a possibility, but is not very      consistent with the exam.  We will check a CT without contrast of      the abdomen and pelvis.  In the meantime, we will perform pain      control with Toradol/acetaminophen/morphine.  A urine pregnancy was      negative.  2. Hypokalemia:  We will replete K-Dur 40 mEq p.o. x1.  3. Bacterial vaginitis:  Patient will get Flagyl 500 mg p.o. t.i.d. x7      days.  FGC/chlamydia/HIV/RPR pending.  4. Anemia:  I suspect this is secondary to menorrhagia.  We will check      orthostatics now and in the a.m.  We will hold on transfusion      unless obviously symptomatic.  We will consider iron sulfate      treatment, but hold for now in the setting of fever and possible      infection.  Consider iron studies, although these might not be      accurate in the setting of acute infection as well.  5. Fever:  Unknown etiology at this time.  We will await the results      of the abdominal CT.  We will hold on empiric antibiotics at this      time with her vital signs stable.  We will check a blood culture.      Her urinalysis looked very clear.  No evidence of any symptoms for      pulmonary, so we will hold on chest x-ray for  now.      Angeline Slim, M.D.    ______________________________ Leighton Roach McDiarmid, M.D.    AL/MEDQ  D:   06/10/2007  T:  06/10/2007  Job:  [id number]

## 2011-05-16 NOTE — Op Note (Signed)
[place]  Patient:    [name], [name] Visit Number: [id number] MRN: [id number]          Service Type: DSU Location: [facility] Attending Physician:  Marlowe Shores Dictated by:   Artist Pais Mina Marble, M.D. Proc. Date: 02/09/02 Admit Date:  02/09/2002                             Operative Report  PREOPERATIVE DIAGNOSIS:  Left wrist dorsal ganglion cyst.  POSTOPERATIVE DIAGNOSIS:  Left wrist dorsal ganglion cyst.  OPERATION/PROCEDURE:  Left wrist arthroscopic ganglionectomy.  SURGEON:  Artist Pais. Mina Marble, M.D.  ASSISTANT:  Junius Roads. Ireton, P.A.C.  ANESTHESIA:  General anesthesia.  TOURNIQUET TIME:  35 minutes.  COMPLICATIONS:  None.  DRAINS:  None.  DESCRIPTION OF PROCEDURE:  The patient was taken to the operating room. After the induction of adequate general anesthesia, the left upper extremity was prepped and draped in the usual sterile fashion. An Esmarch was used to exanguinate the limb and tourniquet was inflated to 250 mmHg.  At this point in time, the patients left upper extremity was placed in a concept retraction tower, well-padded, and 12 pounds of countertraction was placed across the radiocarpal joint.  At this point in time, the ganglion cyst was carefully marked out with a marking pen, as well as a standard 3-4 arthroscopic portal and a 4-5 portal.  The 4-5 portal was established after the skin was excised and blunt dissection was carried down to the capsule. The scope was introduced through the 4-5 portal.  Visualization revealed intact TFCC.  An 18-gauge needle was then used to establish a _________ outflow portal.  This was then followed by visualization of the interval between the scaphoid and lunate dorsally and the dorsal capsule where pressure on the cyst revealed the probable stalk location. At this point in time, an 18-gauge needle was used to rupture the cyst dorsally in the area of the 3-4 portal.  Once this  was done, cyst material could be seen flowing into the radiocarpal joint. This was followed by then removal of the 18-gauge needle and enlargement of the portal and introduction of a 2.9 mm suction shaver.  The ganglion stalk and part of the dorsal capsule was shaved out until extensor tendons were visualized.  The cyst was completely decompressed.  At this point in time, visualization of the rest of the joint revealed intact radial sided ligaments, intact TFCC, and intact articular cartilage throughout the entire radiocarpal joint.  The instruments were then removed. The portals were closed with 4-0 nylon. Marcaine was injected for postoperative pain control and the patient was placed in sterile dressing with Xeroform, 4x4s and a volar splint.  The patient tolerated the procedure well and was stable. Dictated by:   Artist Pais Mina Marble, M.D. Attending Physician:  Marlowe Shores DD:  02/09/02 TD:  02/09/02 Job: [id number]

## 2011-05-16 NOTE — Op Note (Signed)
NAMEALICEA, [name]              ACCOUNT NO.:  [id number]   MEDICAL RECORD NO.:  [id number]          PATIENT TYPE:  INP   LOCATION:  9147                          FACILITY:  [facility]   PHYSICIAN:  Charles A. Clearance Coots, M.D.DATE OF BIRTH:  [birth year]   DATE OF PROCEDURE:  01/30/2005  DATE OF DISCHARGE:                                 OPERATIVE REPORT   PREOPERATIVE DIAGNOSES:  Arrest of descent, thick meconium,  variable fetal  heart rate decelerations persistent.   POSTOPERATIVE DIAGNOSES:  Arrest of descent, thick meconium,  variable fetal  heart rate decelerations persistent.   PROCEDURE:  Primary low transverse cesarean section.   SURGEON:  Coral Ceo, M.D.   ANESTHESIA:  Epidural.   ESTIMATED BLOOD LOSS:  1000 mL.   IV FLUIDS:  2200 mL.   URINE OUT:  200 milliliters   COMPLICATIONS:  None.   DRAINS:  Foley to gravity.   FINDINGS:  Viable female at 22, Apgar's of 9 at 1 minutes,  9 at 5 minutes,  weight of 7 pounds and 4 ounces. Normal uterus, ovaries and fallopian tube.   DESCRIPTION OF PROCEDURE:  The patient was brought to the operating room and  after satisfactory redosing of the epidural,  the abdomen was prepped and  draped in the usual sterile fashion. A Pfannenstiel's skin incision was made  with the  scalpel that was deep and down to the fascia with the scalpel. The  fascia was nicked in the midline and the fascial incision was extended to  the left and to the right with curved Mayo scissors. The superior and  inferior fascial edges were taken off the rectus muscle both blunt, sharp  dissection. The rectus muscle was bluntly and sharply divided in the  midline.  The peritoneum was entered digitally and was digitally extended to  the left and to the right. The bladder blade was positioned and the  vesicouterine fold peritoneum above the reflection of the urinary bladder  was grasped with forceps and was incised with Metzenbaum scissors. The  incision was then  undermined and extended to the left and to the right with  Metzenbaum scissors. The bladder flap was bluntly developed and the bladder  blade was repositioned in front of the urinary bladder placing it well out  of the operative field. The uterus was then entered transversely in the  lower uterine segment with a scalpel, thick amniotic fluid was expelled. The  incision was then extended to the left and to the right digitally. The  vertex was then delivered with the aid of fundal pressure from the  assistant, the infant's mouth and nose was suctioned with a suction bulb and  the delivery was completed with the aid of fundal pressure from the  assistant. The umbilical cord was doubly clamped and cut and the infant was  handed off to the nursery staff. Cord pH and cord blood were obtained.  Placenta was then manually removed from the uterus intact. The endometrial  surface was thoroughly debrided with a dry lap sponge. The edges of the  uterine incision were grasped with ring forceps  and the uterus was closed  with continuous interlocking suture of #0 Monocryl in the first layer, the  second layer was closed with a continuous imbricating suture of #0 Monocryl.  Hemostasis was excellent. The pelvic cavity was thoroughly irrigated with  warm saline solution and all clots were removed. The abdomen was then closed  as follows:  The peritoneum was closed with a continuous suture, 2-0  Monocryl, the fascia was closed with continuous suture of #0 PDS, the  subcutaneous tissue was thoroughly irrigated with warm saline solution and  all areas of subcutaneous bleeding were coagulated with the Bovie.  The skin  was then closed with continuous subcuticular suture of 3-0 Monocryl. A  sterile bandage was applied to the incision for closure. The surgical  technician indicated that all needle, sponge and instrument counts were  correct. The patient tolerated procedure well and was transported to the  recovery  room in satisfactory addition.      CAH/MEDQ  D:  01/30/2005  T:  01/30/2005  Job:  [id number]

## 2011-05-16 NOTE — Discharge Summary (Signed)
NAMECHENISE, [name]              ACCOUNT NO.:  [id number]   MEDICAL RECORD NO.:  [id number]          PATIENT TYPE:  INP   LOCATION:  9147                          FACILITY:  [facility]   PHYSICIAN:  Charles A. Clearance Coots, M.D.DATE OF BIRTH:  Jun 19, 1986   DATE OF ADMISSION:  01/30/2005  DATE OF DISCHARGE:  02/02/2005                                 DISCHARGE SUMMARY   ADMISSION DIAGNOSES:  1.  [redacted] weeks gestation.  2.  Active labor.   DISCHARGE DIAGNOSES:  1.  [redacted] weeks gestation.  2.  Active labor.  3.  Status post primary low transverse cesarean section for arrest of      descent, thick meconium, variable fetal heart rate decelerations.  4.  Status post delivery on January 30, 2005, of a viable female at 1838,      Apgars of 9 at one minute and 9 at five minutes, weight of 7 pounds 4      ounces, and cord pH of 7.29.  5.  Mother and infant discharged home in good condition.   REASON FOR ADMISSION:  A 25 year old black female, gravida 1, with estimated  date of confinement of January 29, 2005, presented with strong uterine  contractions.  Denied rupture of membranes, fever, chills, or dysuria.  Prenatal care was uncomplicated.  Group B Strep culture was negative.   PAST MEDICAL HISTORY:  Surgeries; tonsils and wrist surgery.  Illnesses;  none.   MEDICATIONS:  Prenatal vitamins.   ALLERGIES:  No known drug allergies.   SOCIAL HISTORY:  Adolescent.  Negative tobacco, alcohol, or recreational  drug use.   PHYSICAL EXAMINATION:  GENERAL:  Slightly obese black female in no acute  distress.  VITAL SIGNS:  Temperature 98, pulse 95, respiratory rate 20, blood pressure  145/83.  LUNGS:  Clear to auscultation bilaterally.  HEART:  Regular rate and rhythm.  ABDOMEN:  Gravid and nontender.  PELVIC:  Cervix 5 cm dilated, 80% effaced, and vertex at -2 station.   LABORATORY DATA:  Hemoglobin 11.2, hematocrit 31, white blood cell count  7500, platelets 208,000.   HOSPITAL COURSE:  The patient  was admitted and membranes were ruptured at  1600 revealing thick meconium.  The patient was having moderate variable  decelerations at the time and a decision was made to amnioinfuse for thick  meconium and moderate variable decelerations.  The patient continued to have  persistent variable fetal heart rate decelerations.  The intrauterine  pressure catheter which had been inserted for amnioinfusion also revealed  adequate contractility for progression of labor.  However, over the next  five hours, the patient had no cervical change or descent of the vertex and  because of thick meconium and persistent variable fetal heart rate  decelerations, the decision was made to proceed with cesarean section  delivery for arrest of descent, thick meconium, and variable fetal heart  rate decelerations.  Primary low transverse cesarean section was performed  on January 30, 2005.  A viable female was delivered at 1838 with Apgars of 9  at one minute and 9 at five minutes, weight of 7 pounds 4  ounces, cord pH of  7.29.  Postoperative course was uncomplicated except for postoperative  anemia because of uterine atony during surgery, but there were no  hemodynamic changes orthostatically.  The patient tolerated the decrease in  hemoglobin quite well and had no symptoms.  Transfusion was therefore not  necessary.  The patient's postoperative course continued to be uncomplicated  and she was discharged home on postoperative day #3 in good condition.   DISCHARGE LABORATORY DATA:  Hemoglobin 6.8, hematocrit 19.6, white blood  cell count 12,600, platelets 181,000.   DISPOSITION:  Ibuprofen and Tylox were prescribed for pain.  Iron was  prescribed for anemia in the form of Niferex 150 Forte.  The patient is to  continue taking prenatal vitamins.  Routine written instructions per booklet  for obstetrical discharge was given to the patient.  She is to call our  office for a follow-up appointment in two  weeks.      CAH/MEDQ  D:  02/02/2005  T:  02/03/2005  Job:  [id number]

## 2011-10-16 LAB — CBC
HCT: 21.4 — ABNORMAL LOW
HCT: 26.3 — ABNORMAL LOW
Hemoglobin: 6.9 — CL
Hemoglobin: 7.1 — CL
MCHC: 32.2
MCHC: 32.2
MCHC: 32.5
MCHC: 33.1
MCHC: 33.5
MCV: 77 — ABNORMAL LOW
MCV: 78
MCV: 78.4
Platelets: 296
Platelets: 307
Platelets: 365
RBC: 2.74 — ABNORMAL LOW
RBC: 2.78 — ABNORMAL LOW
RDW: 14.1 — ABNORMAL HIGH
RDW: 14.3 — ABNORMAL HIGH
RDW: 14.4 — ABNORMAL HIGH
WBC: 4.3
WBC: 8.7

## 2011-10-16 LAB — BASIC METABOLIC PANEL
BUN: 9
CO2: 26
CO2: 28
Calcium: 8.4
Calcium: 8.7
Chloride: 105
Creatinine, Ser: 0.61
GFR calc Af Amer: >60
Glucose, Bld: 85
Sodium: 138

## 2011-10-16 LAB — URINALYSIS, ROUTINE W REFLEX MICROSCOPIC
Nitrite: NEGATIVE
Specific Gravity, Urine: 1.037 — ABNORMAL HIGH
pH: 6

## 2011-10-16 LAB — CULTURE, ROUTINE-ABSCESS: Culture: NO GROWTH

## 2011-10-16 LAB — COMPREHENSIVE METABOLIC PANEL
Albumin: 3.3 — ABNORMAL LOW
BUN: 10
CO2: 29
Calcium: 9.2
Chloride: 105
Creatinine, Ser: 0.62
GFR calc non Af Amer: >60
Total Bilirubin: 0.5

## 2011-10-16 LAB — GC/CHLAMYDIA PROBE AMP, GENITAL
Chlamydia, DNA Probe: POSITIVE — AB
GC Probe Amp, Genital: NEGATIVE

## 2011-10-16 LAB — CROSSMATCH: ABO/RH(D): AB POS

## 2011-10-16 LAB — GC/CHLAMYDIA PROBE AMP, URINE: Chlamydia, Swab/Urine, PCR: POSITIVE — AB

## 2011-10-16 LAB — LIPID PANEL
Cholesterol: 87
LDL Cholesterol: 49
VLDL: 8

## 2011-10-16 LAB — WET PREP, GENITAL: Yeast Wet Prep HPF POC: NONE SEEN

## 2011-10-16 LAB — ABO/RH: ABO/RH(D): AB POS

## 2011-10-16 LAB — CULTURE, BLOOD (ROUTINE X 2): Culture: NO GROWTH

## 2011-10-16 LAB — FOLATE: Folate: 10.3

## 2011-10-16 LAB — APTT: aPTT: 40 — ABNORMAL HIGH

## 2011-10-16 LAB — DIFFERENTIAL
Basophils Absolute: 0
Lymphocytes Relative: 16
Neutro Abs: 6.7

## 2011-10-16 LAB — HEPATITIS PANEL, ACUTE
Hep B C IgM: NEGATIVE
Hepatitis B Surface Ag: NEGATIVE

## 2011-10-16 LAB — LIPASE, BLOOD: Lipase: 18

## 2012-06-02 ENCOUNTER — Encounter: Payer: Medicaid Other | Admitting: Family Medicine

## 2012-08-22 ENCOUNTER — Emergency Department (HOSPITAL_COMMUNITY)
Admission: EM | Admit: 2012-08-22 | Discharge: 2012-08-22 | Disposition: A | Discharge status: Home / Self Care | Payer: No Typology Code available for payment source | Attending: Emergency Medicine | Admitting: Emergency Medicine

## 2012-08-22 ENCOUNTER — Encounter (HOSPITAL_COMMUNITY): Payer: Self-pay | Admitting: Emergency Medicine

## 2012-08-22 ENCOUNTER — Emergency Department (HOSPITAL_COMMUNITY): Payer: No Typology Code available for payment source

## 2012-08-22 DIAGNOSIS — M542 Cervicalgia: Secondary | ICD-10-CM | POA: Insufficient documentation

## 2012-08-22 DIAGNOSIS — M25519 Pain in unspecified shoulder: Secondary | ICD-10-CM | POA: Insufficient documentation

## 2012-08-22 MED ORDER — IBUPROFEN 600 MG PO TABS: 600.0000 mg | ORAL_TABLET | Freq: Four times a day (QID) | ORAL | Status: AC | PRN

## 2012-08-22 MED ORDER — IBUPROFEN 800 MG PO TABS
800.0000 mg | ORAL_TABLET | Freq: Once | ORAL | Status: AC
  Administered 2012-08-22: 800 mg via ORAL
  Filled 2012-08-22: qty 1
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
## 2012-12-06 ENCOUNTER — Emergency Department (HOSPITAL_COMMUNITY): Payer: No Typology Code available for payment source

## 2012-12-06 ENCOUNTER — Emergency Department (HOSPITAL_COMMUNITY)
Admission: EM | Admit: 2012-12-06 | Discharge: 2012-12-06 | Disposition: A | Discharge status: Home / Self Care | Payer: No Typology Code available for payment source | Attending: Emergency Medicine | Admitting: Emergency Medicine

## 2012-12-06 ENCOUNTER — Encounter (HOSPITAL_COMMUNITY): Payer: Self-pay | Admitting: *Deleted

## 2012-12-06 DIAGNOSIS — F17200 Nicotine dependence, unspecified, uncomplicated: Secondary | ICD-10-CM | POA: Insufficient documentation

## 2012-12-06 DIAGNOSIS — Y9389 Activity, other specified: Secondary | ICD-10-CM | POA: Insufficient documentation

## 2012-12-06 DIAGNOSIS — IMO0002 Reserved for concepts with insufficient information to code with codable children: Secondary | ICD-10-CM | POA: Insufficient documentation

## 2012-12-06 DIAGNOSIS — I020 Rheumatic chorea with heart involvement: Secondary | ICD-10-CM | POA: Insufficient documentation

## 2012-12-06 DIAGNOSIS — T148XXA Other injury of unspecified body region, initial encounter: Secondary | ICD-10-CM

## 2012-12-06 DIAGNOSIS — Y92410 Unspecified street and highway as the place of occurrence of the external cause: Secondary | ICD-10-CM | POA: Insufficient documentation

## 2012-12-06 MED ORDER — IBUPROFEN 600 MG PO TABS: 600.0000 mg | ORAL_TABLET | Freq: Four times a day (QID) | ORAL | Status: DC | PRN

## 2012-12-06 MED ORDER — IBUPROFEN 400 MG PO TABS
600.0000 mg | ORAL_TABLET | Freq: Once | ORAL | Status: AC
  Administered 2012-12-06: 600 mg via ORAL
  Filled 2012-12-06: qty 1

## 2012-12-06 MED ORDER — HYDROCODONE-ACETAMINOPHEN 5-325 MG PO TABS: 1.0000 | ORAL_TABLET | Freq: Four times a day (QID) | ORAL | Status: DC | PRN
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
## 2013-08-03 ENCOUNTER — Encounter (HOSPITAL_COMMUNITY): Payer: Self-pay | Admitting: Emergency Medicine

## 2013-08-03 ENCOUNTER — Emergency Department (HOSPITAL_COMMUNITY)
Admission: EM | Admit: 2013-08-03 | Discharge: 2013-08-03 | Disposition: A | Discharge status: Home / Self Care | Payer: Self-pay | Attending: Emergency Medicine | Admitting: Emergency Medicine

## 2013-08-03 DIAGNOSIS — IMO0002 Reserved for concepts with insufficient information to code with codable children: Secondary | ICD-10-CM | POA: Insufficient documentation

## 2013-08-03 DIAGNOSIS — Y939 Activity, unspecified: Secondary | ICD-10-CM | POA: Insufficient documentation

## 2013-08-03 DIAGNOSIS — T63461A Toxic effect of venom of wasps, accidental (unintentional), initial encounter: Secondary | ICD-10-CM | POA: Insufficient documentation

## 2013-08-03 DIAGNOSIS — L03113 Cellulitis of right upper limb: Secondary | ICD-10-CM

## 2013-08-03 DIAGNOSIS — T6391XA Toxic effect of contact with unspecified venomous animal, accidental (unintentional), initial encounter: Secondary | ICD-10-CM | POA: Insufficient documentation

## 2013-08-03 DIAGNOSIS — F17200 Nicotine dependence, unspecified, uncomplicated: Secondary | ICD-10-CM | POA: Insufficient documentation

## 2013-08-03 DIAGNOSIS — Y929 Unspecified place or not applicable: Secondary | ICD-10-CM | POA: Insufficient documentation

## 2013-08-03 MED ORDER — CEPHALEXIN 500 MG PO CAPS: 500.0000 mg | ORAL_CAPSULE | Freq: Four times a day (QID) | ORAL | Status: DC

## 2013-08-03 MED ORDER — SULFAMETHOXAZOLE-TRIMETHOPRIM 800-160 MG PO TABS: 1.0000 | ORAL_TABLET | Freq: Two times a day (BID) | ORAL | Status: DC

## 2013-08-03 NOTE — ED Notes (Signed)
PT. STUNG BY A BEE Saturday AT RIGHT UPPER ARM WITH PAIN / ITCHING UNRELIEVED BY OTC BENADRYL , RESPIRATIONS UNLABORED / AIRWAY INTACT .

## 2013-08-03 NOTE — ED Provider Notes (Signed)
History  This chart was scribed for non-physician practitioner, Fayrene Helper PA-C, working with Juliet Rude. Rubin Payor, MD by Ardeen Jourdain, ED Scribe. This patient was seen in room [facility] and the patient's care was started at 2201.  CSN: [id number]     Arrival date & time 08/03/13  2039   First MD Initiated Contact with Patient 08/03/13 2201     Chief Complaint  Patient presents with  . Insect Bite    The history is provided by the patient. No language interpreter was used.    HPI Comments: [name] is a 27 y.o. female who presents to the Emergency Department complaining of a bee sting to the right upper arm that occurred 5 days ago. Pt is complaining of pain and itching around the sting. She reports taking benadryl with no relief. She admits to frequently itching the area. She denies any trouble breathing, fever, nausea, emesis or rash as associated symptoms. She denies any allergies to bee stings.    History reviewed. No pertinent past medical history. History reviewed. No pertinent past surgical history. No family history on file.  History  Substance Use Topics  . Smoking status: Current Every Day Smoker -- 0.50 packs/day  . Smokeless tobacco: Not on file  . Alcohol Use: No   No OB history available.  Review of Systems  Skin:       Insect bite  All other systems reviewed and are negative.    Allergies  Review of patient's allergies indicates no known allergies.  Home Medications   Current Outpatient Rx  Name  Route  Sig  Dispense  Refill  . diphenhydrAMINE (BENADRYL) 25 mg capsule   Oral   Take 25 mg by mouth daily as needed for itching or allergies.         . cephALEXin (KEFLEX) 500 MG capsule   Oral   Take 1 capsule (500 mg total) by mouth 4 (four) times daily.   28 capsule   0   . sulfamethoxazole-trimethoprim (BACTRIM DS,SEPTRA DS) 800-160 MG per tablet   Oral   Take 1 tablet by mouth 2 (two) times daily.   6 tablet   0    Triage Vitals:  BP 117/72  Pulse 84  Temp(Src) 98.4 F (36.9 C) (Oral)  Resp 20  SpO2 100%  LMP 07/03/2013 Physical Exam  Nursing note and vitals reviewed. Constitutional: She is oriented to person, place, and time. She appears well-developed and well-nourished. No distress.  HENT:  Head: Normocephalic and atraumatic.  Eyes: EOM are normal. Pupils are equal, round, and reactive to light.  Neck: Normal range of motion. Neck supple. No tracheal deviation present.  Cardiovascular: Normal rate, regular rhythm and normal heart sounds.  Exam reveals no gallop and no friction rub.   No murmur heard. Pulmonary/Chest: Effort normal and breath sounds normal. No respiratory distress. She has no wheezes. She has no rales. She exhibits no tenderness.  Abdominal: Soft. She exhibits no distension.  Musculoskeletal: Normal range of motion. She exhibits no edema.  Neurological: She is alert and oriented to person, place, and time.  Skin: Skin is warm and dry.  3 by 5 inch area of moderate erythema and warmth noted to the medial aspect of right upper arm. No induration or fluctuance. No stinger present.   Psychiatric: She has a normal mood and affect. Her behavior is normal.    ED Course   Procedures (including critical care time)  DIAGNOSTIC STUDIES: Oxygen Saturation is 100% on room  air, normal by my interpretation.    COORDINATION OF CARE:  10:21 PM-Discussed treatment plan which includes antibiotics and instructions for home care with pt at bedside and pt agreed to plan.    Labs Reviewed - No data to display No results found. 1. Cellulitis of right upper arm   2. Bee sting reaction, initial encounter     MDM  BP 117/72  Pulse 84  Temp(Src) 98.4 F (36.9 C) (Oral)  Resp 20  SpO2 100%  LMP 07/03/2013   I personally performed the services described in this documentation, which was scribed in my presence. The recorded information has been reviewed and is accurate.     Fayrene Helper,  PA-C 08/03/13 2224

## 2013-08-04 NOTE — ED Provider Notes (Signed)
Medical screening examination/treatment/procedure(s) were performed by non-physician practitioner and as supervising physician I was immediately available for consultation/collaboration.  Levent Kornegay R. Sydelle Sherfield, MD 08/04/13 2335 

## 2014-05-29 DIAGNOSIS — Z8619 Personal history of other infectious and parasitic diseases: Secondary | ICD-10-CM

## 2014-05-29 HISTORY — DX: Personal history of other infectious and parasitic diseases: Z86.19

## 2014-06-02 ENCOUNTER — Encounter: Payer: Self-pay | Admitting: Family Medicine

## 2014-06-02 ENCOUNTER — Ambulatory Visit (INDEPENDENT_AMBULATORY_CARE_PROVIDER_SITE_OTHER): Payer: No Typology Code available for payment source | Admitting: Family Medicine

## 2014-06-02 ENCOUNTER — Other Ambulatory Visit (HOSPITAL_COMMUNITY)
Admission: RE | Admit: 2014-06-02 | Discharge: 2014-06-02 | Disposition: A | Discharge status: Home / Self Care | Payer: No Typology Code available for payment source | Source: Ambulatory Visit | Attending: Family Medicine | Admitting: Family Medicine

## 2014-06-02 VITALS — BP 122/87 | HR 69 | Temp 98.7°F | Ht 65.25 in | Wt 185.5 lb

## 2014-06-02 DIAGNOSIS — Z1151 Encounter for screening for human papillomavirus (HPV): Secondary | ICD-10-CM | POA: Insufficient documentation

## 2014-06-02 DIAGNOSIS — Z124 Encounter for screening for malignant neoplasm of cervix: Secondary | ICD-10-CM

## 2014-06-02 DIAGNOSIS — Z0142 Encounter for cervical smear to confirm findings of recent normal smear following initial abnormal smear: Secondary | ICD-10-CM | POA: Insufficient documentation

## 2014-06-02 DIAGNOSIS — Z01419 Encounter for gynecological examination (general) (routine) without abnormal findings: Secondary | ICD-10-CM | POA: Insufficient documentation

## 2014-06-02 DIAGNOSIS — Z309 Encounter for contraceptive management, unspecified: Secondary | ICD-10-CM | POA: Insufficient documentation

## 2014-06-02 DIAGNOSIS — R87810 Cervical high risk human papillomavirus (HPV) DNA test positive: Secondary | ICD-10-CM | POA: Insufficient documentation

## 2014-06-02 DIAGNOSIS — A5901 Trichomonal vulvovaginitis: Secondary | ICD-10-CM

## 2014-06-02 DIAGNOSIS — N760 Acute vaginitis: Secondary | ICD-10-CM

## 2014-06-02 LAB — POCT WET PREP (WET MOUNT): Clue Cells Wet Prep Whiff POC: POSITIVE

## 2014-06-02 MED ORDER — METRONIDAZOLE 500 MG PO TABS: 500.0000 mg | ORAL_TABLET | Freq: Three times a day (TID) | ORAL | Status: DC

## 2014-06-02 NOTE — Patient Instructions (Signed)
You have a trich infection.  I sent in a prescription for you to treat it.  Your partner should go to the doctor and also get treated. I recommend an IUD for birth control.  Please make an appointment on the way out for our [facility] clinic for an IUD insertion. It helps to take 3 ibuprofen 30-60 minutes before you have the IUD. No alcohol when on the antibiotic.

## 2014-06-02 NOTE — Assessment & Plan Note (Signed)
Pap done

## 2014-06-02 NOTE — Assessment & Plan Note (Addendum)
Return for IUD insertion after clearing vag infection  Needs pregnancy test x 2 1 week apart.

## 2014-06-02 NOTE — Assessment & Plan Note (Signed)
Will treat and instructed partner also needs treatment. Await GC and chlamydia results.

## 2014-06-02 NOTE — Progress Notes (Signed)
   Subjective:    Patient ID: [name], female    DOB: 10-Jul-1986, 28 y.o.   MRN: [id number]  HPI Not currently using contraception.  Does not want pills or shots.  After discussion, would use IUD. No pain or vag discharge. Needs pap to follow up previous abnormal.   Review of Systems     Objective:   Physical Exam Vag discharge, wet prep taken Pap done. No bimanual tenderness.        Assessment & Plan:

## 2014-06-05 LAB — CYTOLOGY - PAP

## 2014-06-05 NOTE — Addendum Note (Signed)
Addended by: Moses Manners on: 06/05/2014 04:55 PM   Modules accepted: Orders

## 2014-06-09 ENCOUNTER — Other Ambulatory Visit: Payer: Self-pay | Admitting: Family Medicine

## 2014-06-09 MED ORDER — TINIDAZOLE 500 MG PO TABS: 2.0000 g | ORAL_TABLET | Freq: Once | ORAL | Status: DC

## 2014-06-09 NOTE — Telephone Encounter (Signed)
Patient called in. Unable to tolerate flagyl. D/c flagyl. Tinidazole 2 gm once.

## 2014-06-09 NOTE — Telephone Encounter (Signed)
Pt called stating should could not tolerate the flagyl.  Every time she takes one pill she vomits even after she eat something.  Precepted with Dr. Armen PickupFunches.  Medication changed to tinidazole 2 gm.  Clovis PuMartin, Della Homan L, RN

## 2014-06-14 ENCOUNTER — Ambulatory Visit (INDEPENDENT_AMBULATORY_CARE_PROVIDER_SITE_OTHER): Payer: No Typology Code available for payment source | Admitting: Family Medicine

## 2014-06-14 ENCOUNTER — Encounter: Payer: Self-pay | Admitting: Family Medicine

## 2014-06-14 VITALS — BP 122/86 | HR 78 | Ht 65.25 in | Wt 187.0 lb

## 2014-06-14 DIAGNOSIS — A5901 Trichomonal vulvovaginitis: Secondary | ICD-10-CM

## 2014-06-20 NOTE — Progress Notes (Signed)
Family Medicine Office Visit Note   Subjective:   Patient ID: [name], female  DOB: 07/08/1986, 28 y.o.. MRN: [id number]   Pt that comes today to follow up recent diagnosis with Trichomonas. She did not tolerate metronidazole but was able to complete tinidazole course. She denies vaginal discharge or other symptoms. She was asymptomatic before diagnosis and continues to be.   Review of Systems:  Pt denies fever, chills, nausea, vomiting. No changes on urinary or BM habits. No skin rashes or joint pain/symptoms.  Objective:   Physical Exam: Gen:  NAD HEENT: Moist mucous membranes  CV: Regular rate and rhythm, no murmurs rubs or gallops PULM: Clear to auscultation bilaterally. No wheezes/rales/rhonchi ABD: Soft, non tender, non distended, normal bowel sounds EXT: No edema Neuro: Alert and oriented x3. No focalization  Assessment & Plan:

## 2014-06-20 NOTE — Assessment & Plan Note (Signed)
Came for TOC. Since she was asymptomatic prior diagnosis and continues to be with full course of Tinidazole, TOC is unnecessary. Pt reports partner was treated as well. Discussed signs that should prompt re-evaluation.

## 2014-06-20 NOTE — Patient Instructions (Signed)
You don't need to be rechecked for test of cure if you don't have symptoms and completed the full course of Tinidazole as prescribed.

## 2014-07-06 ENCOUNTER — Ambulatory Visit: Payer: No Typology Code available for payment source

## 2014-07-31 ENCOUNTER — Encounter: Payer: Self-pay | Admitting: Family Medicine

## 2014-07-31 ENCOUNTER — Ambulatory Visit (INDEPENDENT_AMBULATORY_CARE_PROVIDER_SITE_OTHER): Payer: No Typology Code available for payment source | Admitting: Family Medicine

## 2014-07-31 VITALS — BP 122/79 | HR 80 | Temp 98.2°F | Ht 65.0 in | Wt 189.0 lb

## 2014-07-31 DIAGNOSIS — N898 Other specified noninflammatory disorders of vagina: Secondary | ICD-10-CM

## 2014-07-31 DIAGNOSIS — B373 Candidiasis of vulva and vagina: Secondary | ICD-10-CM

## 2014-07-31 DIAGNOSIS — B3731 Acute candidiasis of vulva and vagina: Secondary | ICD-10-CM

## 2014-07-31 LAB — POCT WET PREP (WET MOUNT): Clue Cells Wet Prep Whiff POC: NEGATIVE

## 2014-07-31 MED ORDER — FLUCONAZOLE 150 MG PO TABS: 150.0000 mg | ORAL_TABLET | Freq: Once | ORAL | Status: DC

## 2014-07-31 NOTE — Assessment & Plan Note (Addendum)
History and pelvic exam consistent with vaginal yeast infection - Recent antibiotic course (for BV / Trich)  Plan: 1. Wet Prep - confirmed +many yeast (hyphae / spores), neg trich, neg whiff, few clue - Pt waited for results, discussed at time of visit 2. Diflucan 150mg  PO x 2 tabs (Day 1 and Day 3 if unresolved) 3. RTC PRN

## 2014-07-31 NOTE — Patient Instructions (Addendum)
Dear [name], Thank you for coming in to clinic today.  Today we discussed your Vaginal Itching. 1. Our swab test here in the lab showed a large amount of Yeast - consistent with a yeast infection. 2. There was no evidence of Bacterial Vaginosis or Trich infection 3. Sent prescription for Diflucan 150mg  tabs, take 1 today, and then take the second tablet on Day 3 if your symptoms have not resolved.  Please schedule a follow-up appointment with Dr. Leveda AnnaHensel within 1-2 weeks if your symptoms do not resolve.  If you have any other questions or concerns, please feel free to call the clinic to contact me. You may also schedule an earlier appointment if necessary.  However, if your symptoms get significantly worse, please go to the Emergency Department to seek immediate medical attention.  Saralyn PilarAlexander Madalina Rosman, DO [facility]

## 2014-07-31 NOTE — Progress Notes (Signed)
Subjective:     Patient ID: [name], female   DOB: 08/25/1986, 28 y.o.   MRN: [id number]  Patient presents for same day appointment.  HPI  Vaginitis / Itching: - Reported recent OV on 06/14/14, treated for vaginal discharge noted to have trich / BV, previously did not tolerate Metronidazole, returned and treated with Tinidazole (completed course) - Returns today complaining of 1 week of vaginal itching - Denies vaginal discharge, odor, rash - States she is currently not sexually active since last visit   I have reviewed and updated the following as appropriate: allergies and current medications  Social Hx: Active smoker  Review of Systems  See above HPI    Objective:   Physical Exam  BP 122/79  Pulse 80  Temp(Src) 98.2 F (36.8 C) (Oral)  Ht 5\' 5"  (1.651 m)  Wt 189 lb (85.73 kg)  BMI 31.45 kg/m2  LMP 07/09/2014  Gen - well-appearing, NAD HEENT - MMM Abd - soft, NTND, no masses, +active BS Pelvic Exam - Normal external female genitalia. Vaginal canal without lesions. Normal appearing cervix, without lesions or bleeding. Increased white thick discharge on exam. Bimanual exam with some mild burning but no cervical motion tenderness or masses     Assessment:     See specific A&P problem list for details.      Plan:     See specific A&P problem list for details.

## 2014-08-01 NOTE — Progress Notes (Signed)
Patient ID: [name], female   DOB: 11/19/1986, 28 y.o.   MRN: [id number] Reviewed and agree with Dr. Charm RingsK's documentation and management.

## 2015-01-30 ENCOUNTER — Other Ambulatory Visit (HOSPITAL_COMMUNITY)
Admission: RE | Admit: 2015-01-30 | Discharge: 2015-01-30 | Disposition: A | Discharge status: Home / Self Care | Payer: No Typology Code available for payment source | Source: Ambulatory Visit | Attending: Emergency Medicine | Admitting: Emergency Medicine

## 2015-01-30 ENCOUNTER — Emergency Department (INDEPENDENT_AMBULATORY_CARE_PROVIDER_SITE_OTHER)
Admission: EM | Admit: 2015-01-30 | Discharge: 2015-01-30 | Disposition: A | Discharge status: Home / Self Care | Payer: No Typology Code available for payment source | Source: Home / Self Care | Attending: Emergency Medicine | Admitting: Emergency Medicine

## 2015-01-30 ENCOUNTER — Encounter (HOSPITAL_COMMUNITY): Payer: Self-pay | Admitting: *Deleted

## 2015-01-30 DIAGNOSIS — G43009 Migraine without aura, not intractable, without status migrainosus: Secondary | ICD-10-CM

## 2015-01-30 DIAGNOSIS — Z113 Encounter for screening for infections with a predominantly sexual mode of transmission: Secondary | ICD-10-CM | POA: Insufficient documentation

## 2015-01-30 DIAGNOSIS — N760 Acute vaginitis: Secondary | ICD-10-CM | POA: Insufficient documentation

## 2015-01-30 DIAGNOSIS — N938 Other specified abnormal uterine and vaginal bleeding: Secondary | ICD-10-CM

## 2015-01-30 LAB — POCT I-STAT, CHEM 8
BUN: 22 mg/dL (ref 6–23)
CALCIUM ION: 1.25 mmol/L — AB (ref 1.12–1.23)
CHLORIDE: 104 mmol/L (ref 96–112)
CREATININE: 0.7 mg/dL (ref 0.50–1.10)
Glucose, Bld: 88 mg/dL (ref 70–99)
HEMATOCRIT: 40 % (ref 36.0–46.0)
HEMOGLOBIN: 13.6 g/dL (ref 12.0–15.0)
POTASSIUM: 3.9 mmol/L (ref 3.5–5.1)
SODIUM: 142 mmol/L (ref 135–145)
TCO2: 23 mmol/L (ref 0–100)

## 2015-01-30 LAB — POCT URINALYSIS DIP (DEVICE)
Bilirubin Urine: NEGATIVE
GLUCOSE, UA: NEGATIVE mg/dL
HGB URINE DIPSTICK: NEGATIVE
Ketones, ur: NEGATIVE mg/dL
LEUKOCYTES UA: NEGATIVE
NITRITE: NEGATIVE
PH: 7 (ref 5.0–8.0)
Protein, ur: NEGATIVE mg/dL
Specific Gravity, Urine: 1.025 (ref 1.005–1.030)
Urobilinogen, UA: 2 mg/dL — ABNORMAL HIGH (ref 0.0–1.0)

## 2015-01-30 LAB — POCT PREGNANCY, URINE: Preg Test, Ur: NEGATIVE

## 2015-01-30 MED ORDER — METOCLOPRAMIDE HCL 5 MG/ML IJ SOLN
10.0000 mg | Freq: Once | INTRAMUSCULAR | Status: AC
  Administered 2015-01-30: 10 mg via INTRAMUSCULAR

## 2015-01-30 MED ORDER — METOCLOPRAMIDE HCL 5 MG/ML IJ SOLN
INTRAMUSCULAR | Status: AC
  Filled 2015-01-30: qty 2

## 2015-01-30 MED ORDER — PREDNISONE 20 MG PO TABS
20.0000 mg | ORAL_TABLET | Freq: Two times a day (BID) | ORAL | Status: DC
Start: 2015-01-30 — End: 2015-08-14

## 2015-01-30 MED ORDER — DICLOFENAC SODIUM 75 MG PO TBEC: DELAYED_RELEASE_TABLET | ORAL | Status: DC

## 2015-01-30 MED ORDER — DIPHENHYDRAMINE HCL 50 MG/ML IJ SOLN
25.0000 mg | Freq: Once | INTRAMUSCULAR | Status: AC
  Administered 2015-01-30: 25 mg via INTRAMUSCULAR

## 2015-01-30 MED ORDER — METOCLOPRAMIDE HCL 10 MG PO TABS: ORAL_TABLET | ORAL | Status: DC

## 2015-01-30 MED ORDER — MEDROXYPROGESTERONE ACETATE 10 MG PO TABS: 10.0000 mg | ORAL_TABLET | Freq: Every day | ORAL | Status: DC

## 2015-01-30 MED ORDER — DIPHENHYDRAMINE HCL 50 MG/ML IJ SOLN
INTRAMUSCULAR | Status: AC
  Filled 2015-01-30: qty 1

## 2015-01-30 MED ORDER — KETOROLAC TROMETHAMINE 60 MG/2ML IM SOLN
60.0000 mg | Freq: Once | INTRAMUSCULAR | Status: AC
  Administered 2015-01-30: 60 mg via INTRAMUSCULAR

## 2015-01-30 MED ORDER — KETOROLAC TROMETHAMINE 60 MG/2ML IM SOLN
INTRAMUSCULAR | Status: AC
  Filled 2015-01-30: qty 2

## 2015-01-30 NOTE — Discharge Instructions (Signed)
Abnormal Uterine Bleeding Abnormal uterine bleeding can affect women at various stages in life, including teenagers, women in their reproductive years, pregnant women, and women who have reached menopause. Several kinds of uterine bleeding are considered abnormal, including:  Bleeding or spotting between periods.   Bleeding after sexual intercourse.   Bleeding that is heavier or more than normal.   Periods that last longer than usual.  Bleeding after menopause.  Many cases of abnormal uterine bleeding are minor and simple to treat, while others are more serious. Any type of abnormal bleeding should be evaluated by your health care provider. Treatment will depend on the cause of the bleeding. HOME CARE INSTRUCTIONS Monitor your condition for any changes. The following actions may help to alleviate any discomfort you are experiencing:  Avoid the use of tampons and douches as directed by your health care provider.  Change your pads frequently. You should get regular pelvic exams and Pap tests. Keep all follow-up appointments for diagnostic tests as directed by your health care provider.  SEEK MEDICAL CARE IF:   Your bleeding lasts more than 1 week.   You feel dizzy at times.  SEEK IMMEDIATE MEDICAL CARE IF:   You pass out.   You are changing pads every 15 to 30 minutes.   You have abdominal pain.  You have a fever.   You become sweaty or weak.   You are passing large blood clots from the vagina.   You start to feel nauseous and vomit. MAKE SURE YOU:   Understand these instructions.  Will watch your condition.  Will get help right away if you are not doing well or get worse. Document Released: 12/15/2005 Document Revised: 12/20/2013 Document Reviewed: 07/14/2013 [facility] Patient Information 2015 [place], [place]. This information is not intended to replace advice given to you by your health care provider. Make sure you discuss any questions you have with your  health care provider.  Migraine Headache A migraine headache is an intense, throbbing pain on one or both sides of your head. A migraine can last for 30 minutes to several hours. CAUSES  The exact cause of a migraine headache is not always known. However, a migraine may be caused when nerves in the brain become irritated and release chemicals that cause inflammation. This causes pain. Certain things may also trigger migraines, such as:  Alcohol.  Smoking.  Stress.  Menstruation.  Aged cheeses.  Foods or drinks that contain nitrates, glutamate, aspartame, or tyramine.  Lack of sleep.  Chocolate.  Caffeine.  Hunger.  Physical exertion.  Fatigue.  Medicines used to treat chest pain (nitroglycerine), birth control pills, estrogen, and some blood pressure medicines. SIGNS AND SYMPTOMS  Pain on one or both sides of your head.  Pulsating or throbbing pain.  Severe pain that prevents daily activities.  Pain that is aggravated by any physical activity.  Nausea, vomiting, or both.  Dizziness.  Pain with exposure to bright lights, loud noises, or activity.  General sensitivity to bright lights, loud noises, or smells. Before you get a migraine, you may get warning signs that a migraine is coming (aura). An aura may include:  Seeing flashing lights.  Seeing bright spots, halos, or zigzag lines.  Having tunnel vision or blurred vision.  Having feelings of numbness or tingling.  Having trouble talking.  Having muscle weakness. DIAGNOSIS  A migraine headache is often diagnosed based on:  Symptoms.  Physical exam.  A CT scan or MRI of your head. These imaging tests cannot  diagnose migraines, but they can help rule out other causes of headaches. TREATMENT Medicines may be given for pain and nausea. Medicines can also be given to help prevent recurrent migraines.  HOME CARE INSTRUCTIONS  Only take over-the-counter or prescription medicines for pain or  discomfort as directed by your health care provider. The use of long-term narcotics is not recommended.  Lie down in a dark, quiet room when you have a migraine.  Keep a journal to find out what may trigger your migraine headaches. For example, write down:  What you eat and drink.  How much sleep you get.  Any change to your diet or medicines.  Limit alcohol consumption.  Quit smoking if you smoke.  Get 7-9 hours of sleep, or as recommended by your health care provider.  Limit stress.  Keep lights dim if bright lights bother you and make your migraines worse. SEEK IMMEDIATE MEDICAL CARE IF:   Your migraine becomes severe.  You have a fever.  You have a stiff neck.  You have vision loss.  You have muscular weakness or loss of muscle control.  You start losing your balance or have trouble walking.  You feel faint or pass out.  You have severe symptoms that are different from your first symptoms. MAKE SURE YOU:   Understand these instructions.  Will watch your condition.  Will get help right away if you are not doing well or get worse. Document Released: 12/15/2005 Document Revised: 05/01/2014 Document Reviewed: 08/22/2013 [facility] Patient Information 2015 [place]. This information is not intended to replace advice given to you by your health care provider. Make sure you discuss any questions you have with your health care provider.

## 2015-01-30 NOTE — ED Notes (Signed)
Pt  Has  A  History     Of  4  Days  Of  Headache  As  Well    As  Vaginal bleeding       Prolonged          Pt  Reports  Symptoms  [facility]  [facility]  [facility]   [facility]             Pt  Ambulated  To  Exam room  And  Is  In no  Acute  Distress

## 2015-01-30 NOTE — ED Provider Notes (Signed)
Chief Complaint   Vaginal Bleeding   History of Present Illness   [name] is a 29 year old female who presents today for vaginal bleeding off and on for a month and headaches for a week.  The vaginal bleeding has been a little bit less than a normal menstrual flow. There are some clots with it but no tissue. This occurs intermittently. Is not every day. She denies any pelvic pain, cramping, or lower back pain. No urinary symptoms. No fever, chills, nausea, or vomiting. She is sexually active without use of birth control.  The headaches have been going on off and on for a week. The pain is I frontal and described as a dull ache. Is rated 8/10 at the most and today his nadir 10. No associated nausea or vomiting but she does have some photophobia and phonophobia. She denies any diplopia or blurry vision. She's had some numbness of her hands. She denies any weakness or difficulty with speech, swallowing, ambulation, coordination, or balance. She denies any fever, chills, or stiff neck. No head injury, use of anticoagulants, she's not pregnant or in the immediate postpartum period. No neck injury, no eye pain, no one at home as had the same symptoms. No jaw claudication. Denies any history of DVT or thrombophlebitis. The patient has had headaches in the past which are similar to these. She's never been formally diagnosed as having migraines. She denies any triggering factors although does note she is working 12 hour shifts at work. Patient denies that this is the worst headache of her life and denies any sudden onset headaches.  Review of Systems   Other than as noted above, the patient denies any of the following symptoms: Systemic:  No fever or chills GI:  No abdominal pain, nausea, vomiting, diarrhea, constipation, melena or hematochezia. GU:  No dysuria, frequency, urgency, hematuria, vaginal discharge, itching, or abnormal vaginal bleeding.  PMFSH   Past medical history, family  history, social history, meds, and allergies were reviewed.    Physical Examination    Vital signs:  BP 112/72 mmHg  Pulse 67  Temp(Src) 98.1 F (36.7 C)  Resp 16  SpO2 99%  LMP 01/30/2015 General:  Alert, oriented and in no distress. Eyes: PERRLA, full EOMs, no nystagmus.  ENT: TMs normal, nasal mucosa not congested, pharynx is clear. No tenderness to palpation over the skull or facial bones. Neck: Supple, full range of motion, no adenopathy or tenderness. Lungs:  Breath sounds clear and equal bilaterally.  No wheezes, rales or rhonchi. Heart:  Regular rhythm.  No gallops or murmers. Abdomen:  Soft, flat and non-distended.  No organomegaly or mass.  No tenderness, guarding or rebound.  Bowel sounds normally active. Pelvic exam:  Normal external genitalia, vaginal and cervical mucosa were normal, there was no blood or discharge in the vaginal vault and note purulent discharge coming from the cervical os. No pain on cervical motion. Uterus was normal in size and shape and nontender. No adnexal masses or tenderness.  DNA probes for gonorrhea, Chlamydia, Trichomonas, Gardnerella, Candida were obtained. Neurological examination: The patient is alert and oriented x3. Speech is clear, fluent, and appropriate. Cranial nerves are intact. There is no pronator drift and finger to nose was normal. Muscle strength, sensation, and DTRs are normal. Babinskis are downgoing. Station and gait were normal. Romberg sign is negative, patient is able to perform tandem gait well. Skin:  Clear, warm and dry.  Chaperoned by Blima Ledgeriffany Lytle, RN who was present throughout the  pelvic exam.   Labs   Results for orders placed or performed during the hospital encounter of 01/30/15  POCT urinalysis dip (device)  Result Value Ref Range   Glucose, UA NEGATIVE NEGATIVE mg/dL   Bilirubin Urine NEGATIVE NEGATIVE   Ketones, ur NEGATIVE NEGATIVE mg/dL   Specific Gravity, Urine 1.025 1.005 - 1.030   Hgb urine dipstick  NEGATIVE NEGATIVE   pH 7.0 5.0 - 8.0   Protein, ur NEGATIVE NEGATIVE mg/dL   Urobilinogen, UA 2.0 (H) 0.0 - 1.0 mg/dL   Nitrite NEGATIVE NEGATIVE   Leukocytes, UA NEGATIVE NEGATIVE  Pregnancy, urine POC  Result Value Ref Range   Preg Test, Ur NEGATIVE NEGATIVE  I-STAT, chem 8  Result Value Ref Range   Sodium 142 135 - 145 mmol/L   Potassium 3.9 3.5 - 5.1 mmol/L   Chloride 104 96 - 112 mmol/L   BUN 22 6 - 23 mg/dL   Creatinine, Ser 1.61 0.50 - 1.10 mg/dL   Glucose, Bld 88 70 - 99 mg/dL   Calcium, Ion 0.96 (H) 1.12 - 1.23 mmol/L   TCO2 23 0 - 100 mmol/L   Hemoglobin 13.6 12.0 - 15.0 g/dL   HCT 04.5 40.9 - 81.1 %      Course in Urgent Care Center   The following medications were given:  Medications  metoCLOPramide (REGLAN) injection 10 mg (10 mg Intramuscular Given 01/30/15 1805)  ketorolac (TORADOL) injection 60 mg (60 mg Intramuscular Given 01/30/15 1804)  diphenhydrAMINE (BENADRYL) injection 25 mg (25 mg Intramuscular Given 01/30/15 1804)   Assessment   The primary encounter diagnosis was Dysfunctional uterine bleeding. A diagnosis of Migraine without aura and without status migrainosus, not intractable was also pertinent to this visit.       Plan    1.  Meds:  The following meds were prescribed:   Discharge Medication List as of 01/30/2015  5:48 PM    START taking these medications   Details  diclofenac (VOLTAREN) 75 MG EC tablet 1 every 12 hours as needed for migraine headache., Normal    medroxyPROGESTERone (PROVERA) 10 MG tablet Take 1 tablet (10 mg total) by mouth daily., Starting 01/30/2015, Until Discontinued, Normal    metoCLOPramide (REGLAN) 10 MG tablet 1 every 12 hours as needed for migraine headache., Normal    predniSONE (DELTASONE) 20 MG tablet Take 1 tablet (20 mg total) by mouth 2 (two) times daily., Starting 01/30/2015, Until Discontinued, Normal        2.  Patient Education/Counseling:  The patient was given appropriate handouts, self care instructions,  and instructed in symptomatic relief.    3.  Follow up:  The patient was told to follow up here if no better in 3 to 4 days, or sooner if becoming worse in any way, and given some red flag symptoms such as worsening pain, fever, persistent vomiting, or heavy vaginal bleeding which would prompt immediate return.  If irregular bleeding persists, recommended that she follow-up with her primary care physician at the family practice center.     Reuben Likes, MD 01/30/15 [phone]

## 2015-01-31 LAB — CERVICOVAGINAL ANCILLARY ONLY
Chlamydia: NEGATIVE
Neisseria Gonorrhea: NEGATIVE
WET PREP (BD AFFIRM): NEGATIVE
WET PREP (BD AFFIRM): NEGATIVE
Wet Prep (BD Affirm): POSITIVE — AB

## 2015-02-01 NOTE — ED Notes (Signed)
GC/Chlamydia neg., Affirm: Candida and Trich neg., Gardnerella pos.  Message sent to Dr. Lorenz CoasterKeller. Vassie MoselleYork, Geanie Pacifico M 02/01/2015

## 2015-02-02 ENCOUNTER — Telehealth (HOSPITAL_COMMUNITY): Payer: Self-pay | Admitting: Emergency Medicine

## 2015-02-02 MED ORDER — METRONIDAZOLE 500 MG PO TABS: 500.0000 mg | ORAL_TABLET | Freq: Two times a day (BID) | ORAL | Status: DC

## 2015-02-02 NOTE — ED Notes (Signed)
The patient's DNA probe came back positive for Gardnerella. She will need metronidazole 500 mg, #14, 1 twice a day for one week. This will be sent to her pharmacy. We will need to call her and let her know these results.   Reuben Likesavid C Jaslyne Beeck, MD 02/02/15 [phone]

## 2015-02-02 NOTE — Telephone Encounter (Signed)
-----   Message from Vassie MoselleSuzanne M York, RN sent at 02/01/2015  9:48 PM EST ----- Regarding: lab Gardnerella pos. Do you want to treat this? Vassie MoselleYork, Suzanne M 02/01/2015

## 2015-02-04 ENCOUNTER — Telehealth (HOSPITAL_COMMUNITY): Payer: Self-pay | Admitting: *Deleted

## 2015-02-04 NOTE — ED Notes (Signed)
I called pt.  Pt. verified x 2 and given results.  Pt. Told she needs Flagyl for bacterial vaginosis and where to pick up her Rx.   Pt. instructed to no alcohol while taking this medication.  Pt. voiced understanding. Vassie MoselleYork, Strother Everitt M 02/04/2015

## 2015-04-30 ENCOUNTER — Emergency Department (HOSPITAL_COMMUNITY)
Admission: EM | Admit: 2015-04-30 | Discharge: 2015-05-01 | Disposition: A | Discharge status: Home / Self Care | Payer: BLUE CROSS/BLUE SHIELD | Attending: Emergency Medicine | Admitting: Emergency Medicine

## 2015-04-30 ENCOUNTER — Encounter (HOSPITAL_COMMUNITY): Payer: Self-pay

## 2015-04-30 DIAGNOSIS — R109 Unspecified abdominal pain: Secondary | ICD-10-CM | POA: Diagnosis present

## 2015-04-30 DIAGNOSIS — Z79899 Other long term (current) drug therapy: Secondary | ICD-10-CM | POA: Diagnosis not present

## 2015-04-30 DIAGNOSIS — Z720 Tobacco use: Secondary | ICD-10-CM | POA: Insufficient documentation

## 2015-04-30 DIAGNOSIS — Z7952 Long term (current) use of systemic steroids: Secondary | ICD-10-CM | POA: Insufficient documentation

## 2015-04-30 DIAGNOSIS — Z791 Long term (current) use of non-steroidal anti-inflammatories (NSAID): Secondary | ICD-10-CM | POA: Insufficient documentation

## 2015-04-30 DIAGNOSIS — N946 Dysmenorrhea, unspecified: Secondary | ICD-10-CM | POA: Diagnosis not present

## 2015-04-30 DIAGNOSIS — Z3202 Encounter for pregnancy test, result negative: Secondary | ICD-10-CM | POA: Diagnosis not present

## 2015-04-30 NOTE — ED Notes (Signed)
Patient reports menstrual cramps that are worse than usual since yesterday, along with headaches x 2 weeks.

## 2015-05-01 LAB — URINALYSIS, ROUTINE W REFLEX MICROSCOPIC
Bilirubin Urine: NEGATIVE
Glucose, UA: NEGATIVE mg/dL
Ketones, ur: NEGATIVE mg/dL
NITRITE: NEGATIVE
PROTEIN: NEGATIVE mg/dL
Specific Gravity, Urine: 1.026 (ref 1.005–1.030)
UROBILINOGEN UA: 0.2 mg/dL (ref 0.0–1.0)
pH: 6 (ref 5.0–8.0)

## 2015-05-01 LAB — URINE MICROSCOPIC-ADD ON

## 2015-05-01 LAB — POC URINE PREG, ED: Preg Test, Ur: NEGATIVE

## 2015-05-01 MED ORDER — KETOROLAC TROMETHAMINE 30 MG/ML IJ SOLN
30.0000 mg | Freq: Once | INTRAMUSCULAR | Status: AC
  Administered 2015-05-01: 30 mg via INTRAMUSCULAR
  Filled 2015-05-01: qty 1

## 2015-05-01 NOTE — ED Notes (Signed)
Pt states she is experiencing a frontal intermittent headache. "Headaches has been going for a while". Denies any blurry vision, dizziness, lightheadedness, nausea, or vomiting.

## 2015-05-01 NOTE — ED Notes (Signed)
When entering the room, pt resting quietly, watching television.  

## 2015-05-01 NOTE — Discharge Instructions (Signed)
Dysmenorrhea °Dysmenorrhea is pain during a menstrual period. You will have pain in the lower belly (abdomen). The pain is caused by the tightening (contracting) of the muscles of the uterus. The pain can be minor or severe. Headache, feeling sick to your stomach (nausea), throwing up (vomiting), or low back pain may occur with this condition. °HOME CARE °· Only take medicine as told by your doctor. °· Place a heating pad or hot water bottle on your lower back or belly. Do not sleep with a heating pad. °· Exercise may help lessen the pain. °· Massage the lower back or belly. °· Stop smoking. °· Avoid alcohol and caffeine. °GET HELP IF:  °· Your pain does not get better with medicine. °· You have pain during sex. °· Your pain gets worse while taking pain medicine. °· Your period bleeding is heavier than normal. °· You keep feeling sick to your stomach or keep throwing up. °GET HELP RIGHT AWAY IF: °You pass out (faint). °Document Released: 03/13/2009 Document Revised: 12/20/2013 Document Reviewed: 06/02/2013 °Exit[employer]® Patient Information ©2015 [employer]. This information is not intended to replace advice given to you by your health care provider. Make sure you discuss any questions you have with your health care provider. ° °

## 2015-05-01 NOTE — ED Notes (Signed)
Pt states she is experiencing abd and lower back menstrual pain. Started Sunday. Pain described as constant and cramping. Denies any increase in vaginal bleeding.

## 2015-05-01 NOTE — ED Provider Notes (Signed)
CSN: [id number]     Arrival date & time 04/30/15  2058 History   First MD Initiated Contact with Patient 05/01/15 0210     Chief Complaint  Patient presents with  . Abdominal Cramping     (Consider location/radiation/quality/duration/timing/severity/associated sxs/prior Treatment) HPI Comments: Patient states she normally has heavy menstrual cycles with cramping, but for the past 24 hours.  The cramping has been worse.  The bleeding has not changed.  She normally passes clots and change a pad one pad every 1-2 hours.  She took Tylenol sometime yesterday, nothing since.  She also states that she's had a headache for the past 2 weeks, but that has since resolved  Patient is a 29 y.o. female presenting with cramps. The history is provided by the patient.  Abdominal Cramping This is a new problem. The current episode started in the past 7 days. The problem occurs intermittently. The problem has been unchanged. Associated symptoms include abdominal pain. Pertinent negatives include no nausea. Nothing aggravates the symptoms. She has tried nothing for the symptoms. The treatment provided no relief.    History reviewed. No pertinent past medical history. History reviewed. No pertinent past surgical history. No family history on file. History  Substance Use Topics  . Smoking status: Current Every Day Smoker -- 0.50 packs/day  . Smokeless tobacco: Not on file  . Alcohol Use: No   OB History    No data available     Review of Systems  Respiratory: Negative for shortness of breath.   Gastrointestinal: Positive for abdominal pain. Negative for nausea.  Genitourinary: Positive for vaginal bleeding. Negative for vaginal discharge.  All other systems reviewed and are negative.     Allergies  Review of patient's allergies indicates no known allergies.  Home Medications   Prior to Admission medications   Medication Sig Start Date End Date Taking? Authorizing Provider  diclofenac  (VOLTAREN) 75 MG EC tablet 1 every 12 hours as needed for migraine headache. Patient not taking: Reported on 05/01/2015 01/30/15   Reuben Likes, MD  fluconazole (DIFLUCAN) 150 MG tablet Take 1 tablet (150 mg total) by mouth once. If your symptoms persistent, take 1 more tablet on Day 3. Patient not taking: Reported on 05/01/2015 07/31/14   Smitty Cords, DO  medroxyPROGESTERone (PROVERA) 10 MG tablet Take 1 tablet (10 mg total) by mouth daily. Patient not taking: Reported on 05/01/2015 01/30/15   Reuben Likes, MD  metoCLOPramide (REGLAN) 10 MG tablet 1 every 12 hours as needed for migraine headache. Patient not taking: Reported on 05/01/2015 01/30/15   Reuben Likes, MD  metroNIDAZOLE (FLAGYL) 500 MG tablet Take 1 tablet (500 mg total) by mouth 2 (two) times daily. Patient not taking: Reported on 05/01/2015 02/02/15   Reuben Likes, MD  predniSONE (DELTASONE) 20 MG tablet Take 1 tablet (20 mg total) by mouth 2 (two) times daily. Patient not taking: Reported on 05/01/2015 01/30/15   Reuben Likes, MD   BP 158/64 mmHg  Pulse 77  Temp(Src) 98.4 F (36.9 C) (Oral)  Resp 16  Ht  (1.651 m)  Wt 200 lb (90.719 kg)  BMI 33.28 kg/m2  SpO2 100%  LMP 04/29/2015 Physical Exam  Constitutional: She appears well-developed and well-nourished.  HENT:  Head: Normocephalic.  Eyes: Pupils are equal, round, and reactive to light.  Neck: Normal range of motion.  Cardiovascular: Normal rate.   Pulmonary/Chest: Effort normal.  Abdominal: Soft.  Musculoskeletal: Normal range of motion.  Neurological: She  is alert.  Skin: Skin is warm.  Nursing note and vitals reviewed.   ED Course  Procedures (including critical care time) Labs Review Labs Reviewed  URINALYSIS, ROUTINE W REFLEX MICROSCOPIC - Abnormal; Notable for the following:    Color, Urine AMBER (*)    APPearance CLOUDY (*)    Hgb urine dipstick LARGE (*)    Leukocytes, UA SMALL (*)    All other components within normal limits  URINE  MICROSCOPIC-ADD ON  POC URINE PREG, ED    Imaging Review No results found.   EKG Interpretation None      MDM   Final diagnoses:  Menstrual cramps         Earley FavorGail Lianette Broussard, NP 05/01/15 0404  Cy BlamerApril Palumbo, MD 05/01/15 [phone]

## 2015-05-01 NOTE — ED Notes (Signed)
Provided heat packs.  ?

## 2015-08-13 ENCOUNTER — Emergency Department (HOSPITAL_COMMUNITY)
Admission: EM | Admit: 2015-08-13 | Discharge: 2015-08-14 | Disposition: A | Discharge status: Home / Self Care | Payer: BLUE CROSS/BLUE SHIELD | Attending: Emergency Medicine | Admitting: Emergency Medicine

## 2015-08-13 ENCOUNTER — Encounter (HOSPITAL_COMMUNITY): Payer: Self-pay | Admitting: *Deleted

## 2015-08-13 DIAGNOSIS — Z720 Tobacco use: Secondary | ICD-10-CM | POA: Diagnosis not present

## 2015-08-13 DIAGNOSIS — K029 Dental caries, unspecified: Secondary | ICD-10-CM

## 2015-08-13 DIAGNOSIS — K088 Other specified disorders of teeth and supporting structures: Secondary | ICD-10-CM | POA: Diagnosis present

## 2015-08-13 DIAGNOSIS — Z3202 Encounter for pregnancy test, result negative: Secondary | ICD-10-CM | POA: Diagnosis not present

## 2015-08-13 DIAGNOSIS — N760 Acute vaginitis: Secondary | ICD-10-CM | POA: Diagnosis not present

## 2015-08-13 DIAGNOSIS — R001 Bradycardia, unspecified: Secondary | ICD-10-CM | POA: Insufficient documentation

## 2015-08-13 DIAGNOSIS — B9689 Other specified bacterial agents as the cause of diseases classified elsewhere: Secondary | ICD-10-CM

## 2015-08-13 NOTE — ED Notes (Signed)
Patient c/o dental pain to the left side of her face and has been having vaginal discharge for 2 days (foul odor)

## 2015-08-14 LAB — URINALYSIS, ROUTINE W REFLEX MICROSCOPIC
Bilirubin Urine: NEGATIVE
GLUCOSE, UA: NEGATIVE mg/dL
Hgb urine dipstick: NEGATIVE
KETONES UR: NEGATIVE mg/dL
Leukocytes, UA: NEGATIVE
NITRITE: NEGATIVE
PROTEIN: NEGATIVE mg/dL
Specific Gravity, Urine: 1.016 (ref 1.005–1.030)
Urobilinogen, UA: 0.2 mg/dL (ref 0.0–1.0)
pH: 6 (ref 5.0–8.0)

## 2015-08-14 LAB — WET PREP, GENITAL
Trich, Wet Prep: NONE SEEN
Yeast Wet Prep HPF POC: NONE SEEN

## 2015-08-14 LAB — GC/CHLAMYDIA PROBE AMP (~~LOC~~) NOT AT ARMC
CHLAMYDIA, DNA PROBE: NEGATIVE
NEISSERIA GONORRHEA: NEGATIVE

## 2015-08-14 LAB — I-STAT BETA HCG BLOOD, ED (MC, WL, AP ONLY): I-stat hCG, quantitative: <5 m[IU]/mL (ref <5)

## 2015-08-14 MED ORDER — PENICILLIN V POTASSIUM 250 MG PO TABS
500.0000 mg | ORAL_TABLET | Freq: Once | ORAL | Status: AC
  Administered 2015-08-14: 500 mg via ORAL
  Filled 2015-08-14: qty 2

## 2015-08-14 MED ORDER — METRONIDAZOLE 500 MG PO TABS: 500.0000 mg | ORAL_TABLET | Freq: Two times a day (BID) | ORAL | Status: DC

## 2015-08-14 MED ORDER — PENICILLIN V POTASSIUM 500 MG PO TABS: 500.0000 mg | ORAL_TABLET | Freq: Four times a day (QID) | ORAL | Status: AC

## 2015-08-14 MED ORDER — ACETAMINOPHEN 500 MG PO TABS
1000.0000 mg | ORAL_TABLET | Freq: Once | ORAL | Status: AC
  Administered 2015-08-14: 1000 mg via ORAL
  Filled 2015-08-14: qty 2

## 2015-08-14 MED ORDER — NAPROXEN 250 MG PO TABS
500.0000 mg | ORAL_TABLET | Freq: Once | ORAL | Status: AC
  Administered 2015-08-14: 500 mg via ORAL
  Filled 2015-08-14: qty 2

## 2015-08-14 MED ORDER — TRAMADOL HCL 50 MG PO TABS: ORAL_TABLET | ORAL | Status: DC

## 2015-08-14 MED ORDER — TRAMADOL HCL 50 MG PO TABS
100.0000 mg | ORAL_TABLET | Freq: Once | ORAL | Status: AC
  Administered 2015-08-14: 100 mg via ORAL
  Filled 2015-08-14: qty 2

## 2015-08-14 NOTE — Discharge Instructions (Signed)
Take the antibiotics until gone. Take the tramadol for pain with ibuprofen 600 mg + 1000 mg 4 times a day. You need to see a dentist about your tooth.    Bacterial Vaginosis Bacterial vaginosis is a vaginal infection that occurs when the normal balance of bacteria in the vagina is disrupted. It results from an overgrowth of certain bacteria. This is the most common vaginal infection in women of childbearing age. Treatment is important to prevent complications, especially in pregnant women, as it can cause a premature delivery. CAUSES  Bacterial vaginosis is caused by an increase in harmful bacteria that are normally present in smaller amounts in the vagina. Several different kinds of bacteria can cause bacterial vaginosis. However, the reason that the condition develops is not fully understood. RISK FACTORS Certain activities or behaviors can put you at an increased risk of developing bacterial vaginosis, including:  Having a new sex partner or multiple sex partners.  Douching.  Using an intrauterine device (IUD) for contraception. Women do not get bacterial vaginosis from toilet seats, bedding, swimming pools, or contact with objects around them. SIGNS AND SYMPTOMS  Some women with bacterial vaginosis have no signs or symptoms. Common symptoms include:  Grey vaginal discharge.  A fishlike odor with discharge, especially after sexual intercourse.  Itching or burning of the vagina and vulva.  Burning or pain with urination. DIAGNOSIS  Your health care provider will take a medical history and examine the vagina for signs of bacterial vaginosis. A sample of vaginal fluid may be taken. Your health care provider will look at this sample under a microscope to check for bacteria and abnormal cells. A vaginal pH test may also be done.  TREATMENT  Bacterial vaginosis may be treated with antibiotic medicines. These may be given in the form of a pill or a vaginal cream. A second round of  antibiotics may be prescribed if the condition comes back after treatment.  HOME CARE INSTRUCTIONS   Only take over-the-counter or prescription medicines as directed by your health care provider.  If antibiotic medicine was prescribed, take it as directed. Make sure you finish it even if you start to feel better.  Do not have sex until treatment is completed.  Tell all sexual partners that you have a vaginal infection. They should see their health care provider and be treated if they have problems, such as a mild rash or itching.  Practice safe sex by using condoms and only having one sex partner. SEEK MEDICAL CARE IF:   Your symptoms are not improving after 3 days of treatment.  You have increased discharge or pain.  You have a fever. MAKE SURE YOU:   Understand these instructions.  Will watch your condition.  Will get help right away if you are not doing well or get worse. FOR MORE INFORMATION  Centers for Disease Control and Prevention, Division of STD Prevention: [employer] American Sexual Health Association (ASHA): www.ashastd.org  Document Released: 12/15/2005 Document Revised: 10/05/2013 Document Reviewed: 07/27/2013 [facility] Patient Information 2015 [place], [place]. This information is not intended to replace advice given to you by your health care provider. Make sure you discuss any questions you have with your health care provider.

## 2015-08-14 NOTE — ED Notes (Signed)
Discharge instructions/prescriptions reviewed with patient. Questions answered. Stressed importance of taking all antibiotics as prescribed until complete. Understanding verbalized. Reports pain improving gradually. Refused wheelchair at time of discharge. No acute distress noted.

## 2015-08-14 NOTE — ED Provider Notes (Signed)
CSN: [id number]     Arrival date & time 08/13/15  2330 History  This chart was scribed for Deanna Albe, MD by Evon Slack, ED Scribe. This patient was seen in room A03C/A03C and the patient's care was started at 12:30 AM.      Chief Complaint  Patient presents with  . Dental Pain  . Vaginal Discharge   Patient is a 29 y.o. female presenting with tooth pain and vaginal discharge. The history is provided by the patient. No language interpreter was used.  Dental Pain Associated symptoms: no fever   Vaginal Discharge Associated symptoms: no abdominal pain and no fever    HPI Comments: [name] is a 29 y.o. female who presents to the Emergency Department complaining of recurrent upper left sided dental pain onset 2 days prior. Pt reports a filling recently falling out but is unsure if it is the same tooth causing her pain. Pt states that she had similar problems 2 months prior with the same tooth but was unable to have it pulled by her dentist at the time due to insurance issues.   Pt reports also reports vaginal discharge that began 3 days prior. Pt doesn't report any associate symptoms. She denies dysuria or frequency. Pt states that she participates in unprotected sex with the same partner. Pt denies abdominal pain, vaginal pain, or vaginal bleeding. G1P1A0. LMP July 31, 2015.  Pt does report tobacco use daily.    PCP [facility]   History reviewed. No pertinent past medical history. Past Surgical History  Procedure Laterality Date  . Cyst removed     No family history on file. Social History  Substance Use Topics  . Smoking status: Current Every Day Smoker -- 0.50 packs/day  . Smokeless tobacco: Never Used  . Alcohol Use: No   Employed  OB History    No data available     Review of Systems  Constitutional: Negative for fever.  HENT: Positive for dental problem.   Gastrointestinal: Negative for abdominal pain.  Genitourinary: Positive for vaginal discharge. Negative  for vaginal bleeding and vaginal pain.  All other systems reviewed and are negative.    Allergies  Review of patient's allergies indicates no known allergies.  Home Medications   Prior to Admission medications   Medication Sig Start Date End Date Taking? Authorizing Provider  acetaminophen (TYLENOL) 325 MG tablet Take 650 mg by mouth every 6 (six) hours as needed for mild pain.   Yes Historical Provider, MD  metroNIDAZOLE (FLAGYL) 500 MG tablet Take 1 tablet (500 mg total) by mouth 2 (two) times daily. 08/14/15   Deanna Albe, MD  penicillin v potassium (VEETID) 500 MG tablet Take 1 tablet (500 mg total) by mouth 4 (four) times daily. 08/14/15 08/21/15  Deanna Albe, MD  traMADol (ULTRAM) 50 MG tablet Take 1 or 2 po Q 6hrs for pain 08/14/15   Deanna Albe, MD   BP 117/79 mmHg  Pulse 53  Temp(Src) 98.6 F (37 C) (Oral)  Resp 20  Ht 5\' 5"  (1.651 m)  Wt 207 lb 14.4 oz (94.303 kg)  BMI 34.60 kg/m2  SpO2 100%  LMP 07/31/2015  Vital signs normal except bradycardia    Physical Exam  Constitutional: She is oriented to person, place, and time. She appears well-developed and well-nourished.  Non-toxic appearance. She does not appear ill. No distress.  HENT:  Head: Normocephalic and atraumatic.  Right Ear: External ear normal.  Left Ear: External ear normal.  Nose: Nose normal.  No mucosal edema or rhinorrhea.  Mouth/Throat: Oropharynx is clear and moist and mucous membranes are normal. No dental abscesses or uvula swelling.    Eyes: Conjunctivae and EOM are normal. Pupils are equal, round, and reactive to light.  Neck: Normal range of motion and full passive range of motion without pain. Neck supple.  Cardiovascular: Regular rhythm and normal heart sounds.  Bradycardia present.  Exam reveals no gallop and no friction rub.   No murmur heard. Pulmonary/Chest: Effort normal and breath sounds normal. No respiratory distress. She has no wheezes. She has no rhonchi. She has no rales. She exhibits no  tenderness and no crepitus.  Abdominal: Soft. Normal appearance and bowel sounds are normal. She exhibits no distension. There is no tenderness. There is no rebound and no guarding.  Genitourinary:  norma external genitalia. Moderate white thin discharge. Non tender normal sized uterus. Non tender normal sized ovaries.   Musculoskeletal: Normal range of motion. She exhibits no edema or tenderness.  Moves all extremities well.   Neurological: She is alert and oriented to person, place, and time. She has normal strength. No cranial nerve deficit.  Skin: Skin is warm, dry and intact. No rash noted. No erythema. No pallor.  Psychiatric: She has a normal mood and affect. Her speech is normal and behavior is normal. Her mood appears not anxious.  Nursing note and vitals reviewed.   ED Course  Procedures (including critical care time)  Medications  traMADol (ULTRAM) tablet 100 mg (100 mg Oral Given 08/14/15 0311)  acetaminophen (TYLENOL) tablet 1,000 mg (1,000 mg Oral Given 08/14/15 0312)  naproxen (NAPROSYN) tablet 500 mg (500 mg Oral Given 08/14/15 0310)  penicillin v potassium (VEETID) tablet 500 mg (500 mg Oral Given 08/14/15 0454)    DIAGNOSTIC STUDIES: Oxygen Saturation is 100% on RA, normal by my interpretation.    COORDINATION OF CARE: 12:46 AM-Discussed treatment plan with pt at bedside and pt agreed to plan. Patient was started and about aches and pain medications for her tooth. She also was started on Flagyl for her atrial vaginosis.   Labs Review Results for orders placed or performed during the hospital encounter of 08/13/15  Wet prep, genital  Result Value Ref Range   Yeast Wet Prep HPF POC NONE SEEN NONE SEEN   Trich, Wet Prep NONE SEEN NONE SEEN   Clue Cells Wet Prep HPF POC MODERATE (A) NONE SEEN   WBC, Wet Prep HPF POC FEW (A) NONE SEEN  Urinalysis, Routine w reflex microscopic (not at [facility])  Result Value Ref Range   Color, Urine YELLOW YELLOW   APPearance CLEAR CLEAR     Specific Gravity, Urine 1.016 1.005 - 1.030   pH 6.0 5.0 - 8.0   Glucose, UA NEGATIVE NEGATIVE mg/dL   Hgb urine dipstick NEGATIVE NEGATIVE   Bilirubin Urine NEGATIVE NEGATIVE   Ketones, ur NEGATIVE NEGATIVE mg/dL   Protein, ur NEGATIVE NEGATIVE mg/dL   Urobilinogen, UA 0.2 0.0 - 1.0 mg/dL   Nitrite NEGATIVE NEGATIVE   Leukocytes, UA NEGATIVE NEGATIVE  I-Stat beta hCG blood, ED  Result Value Ref Range   I-stat hCG, quantitative <5.0 <5 mIU/mL   Comment 3           Laboratory interpretation all normal except bv     Imaging Review No results found. I, No att. providers found, personally reviewed and evaluated these images and lab results as part of my medical decision-making.   EKG Interpretation None      MDM  Final diagnoses:  Dental caries  BV (bacterial vaginosis)    Discharge Medication List as of 08/14/2015  4:15 AM    START taking these medications   Details  penicillin v potassium (VEETID) 500 MG tablet Take 1 tablet (500 mg total) by mouth 4 (four) times daily., Starting 08/14/2015, Until Tue 08/21/15, Print    traMADol (ULTRAM) 50 MG tablet Take 1 or 2 po Q 6hrs for pain, Print      flagyl 500 mg BID x 7 days  Plan discharge  Deanna Albe, MD, FACEP   I personally performed the services described in this documentation, which was scribed in my presence. The recorded information has been reviewed and considered.  Deanna Albe, MD, Concha Pyo, MD 08/14/15 [phone]

## 2015-08-14 NOTE — ED Notes (Signed)
Pt became upset and stated that she was leaving AMA, RN was able to redirect pt and she calmed down.  Provider completed the pelvic and pt was given pain medication.  Pt also given warm pack to ease the pain of her tooth.

## 2015-08-15 LAB — RPR: RPR: NONREACTIVE

## 2015-08-15 LAB — HIV ANTIBODY (ROUTINE TESTING W REFLEX): HIV SCREEN 4TH GENERATION: NONREACTIVE

## 2015-09-24 ENCOUNTER — Emergency Department (HOSPITAL_COMMUNITY): Payer: BLUE CROSS/BLUE SHIELD

## 2015-09-24 ENCOUNTER — Encounter (HOSPITAL_COMMUNITY): Payer: Self-pay | Admitting: Cardiology

## 2015-09-24 ENCOUNTER — Emergency Department (HOSPITAL_COMMUNITY)
Admission: EM | Admit: 2015-09-24 | Discharge: 2015-09-24 | Disposition: A | Discharge status: Home / Self Care | Payer: BLUE CROSS/BLUE SHIELD | Attending: Emergency Medicine | Admitting: Emergency Medicine

## 2015-09-24 DIAGNOSIS — S6992XA Unspecified injury of left wrist, hand and finger(s), initial encounter: Secondary | ICD-10-CM | POA: Diagnosis present

## 2015-09-24 DIAGNOSIS — Y998 Other external cause status: Secondary | ICD-10-CM | POA: Insufficient documentation

## 2015-09-24 DIAGNOSIS — S60032A Contusion of left middle finger without damage to nail, initial encounter: Secondary | ICD-10-CM | POA: Diagnosis not present

## 2015-09-24 DIAGNOSIS — Z792 Long term (current) use of antibiotics: Secondary | ICD-10-CM | POA: Insufficient documentation

## 2015-09-24 DIAGNOSIS — S60222A Contusion of left hand, initial encounter: Secondary | ICD-10-CM

## 2015-09-24 DIAGNOSIS — W231XXA Caught, crushed, jammed, or pinched between stationary objects, initial encounter: Secondary | ICD-10-CM | POA: Diagnosis not present

## 2015-09-24 DIAGNOSIS — Y9389 Activity, other specified: Secondary | ICD-10-CM | POA: Diagnosis not present

## 2015-09-24 DIAGNOSIS — Z720 Tobacco use: Secondary | ICD-10-CM | POA: Diagnosis not present

## 2015-09-24 DIAGNOSIS — Y9289 Other specified places as the place of occurrence of the external cause: Secondary | ICD-10-CM | POA: Insufficient documentation

## 2015-09-24 DIAGNOSIS — S60042A Contusion of left ring finger without damage to nail, initial encounter: Secondary | ICD-10-CM | POA: Insufficient documentation

## 2015-09-24 DIAGNOSIS — S6000XA Contusion of unspecified finger without damage to nail, initial encounter: Secondary | ICD-10-CM

## 2015-09-24 MED ORDER — NAPROXEN 500 MG PO TABS: 500.0000 mg | ORAL_TABLET | Freq: Two times a day (BID) | ORAL | Status: DC

## 2015-09-24 MED ORDER — HYDROCODONE-ACETAMINOPHEN 5-325 MG PO TABS
1.0000 | ORAL_TABLET | Freq: Once | ORAL | Status: AC
  Administered 2015-09-24: 1 via ORAL
  Filled 2015-09-24: qty 1

## 2015-09-24 NOTE — ED Notes (Signed)
Pt. Found in sub waiting. Roomed to FT6.

## 2015-09-24 NOTE — ED Provider Notes (Signed)
CSN: [id number]     Arrival date & time 09/24/15  1355 History  This chart was scribed for non-physician practitioner Kerrie Buffalo, NP working with Gwyneth Sprout, MD by Murriel Hopper, ED Scribe. This patient was seen in room TR06C/TR06C and the patient's care was started at 4:33 PM.    Chief Complaint  Patient presents with  . Hand Pain      The history is provided by the patient. No language interpreter was used.   HPI Comments: [name] is a 29 y.o. female who presents to the Emergency Department complaining of constant left hand pain that has been present for four hours after pt slammed her hand in a car door. Pt reports that her pain is worst in her left middle and ring fingers. Pt denies doing anything to treat the pain and denies any other injuries.     History reviewed. No pertinent past medical history. Past Surgical History  Procedure Laterality Date  . Cyst removed     History reviewed. No pertinent family history. Social History  Substance Use Topics  . Smoking status: Current Every Day Smoker -- 0.50 packs/day  . Smokeless tobacco: Never Used  . Alcohol Use: No   OB History    No data available     Review of Systems Negative except as stated in HPI  Allergies  Review of patient's allergies indicates no known allergies.  Home Medications   Prior to Admission medications   Medication Sig Start Date End Date Taking? Authorizing Provider  acetaminophen (TYLENOL) 325 MG tablet Take 650 mg by mouth every 6 (six) hours as needed for mild pain.    Historical Provider, MD  metroNIDAZOLE (FLAGYL) 500 MG tablet Take 1 tablet (500 mg total) by mouth 2 (two) times daily. 08/14/15   Devoria Albe, MD  naproxen (NAPROSYN) 500 MG tablet Take 1 tablet (500 mg total) by mouth 2 (two) times daily. 09/24/15   Hope Orlene Och, NP  traMADol (ULTRAM) 50 MG tablet Take 1 or 2 po Q 6hrs for pain 08/14/15   Devoria Albe, MD   BP 122/86 mmHg  Pulse 80  Temp(Src) 98.2 F (36.8 C)  (Oral)  Resp 16  SpO2 99%  LMP 09/01/2015 Physical Exam  Constitutional: She is oriented to person, place, and time. She appears well-developed and well-nourished.  Non-toxic appearance. No distress.  HENT:  Head: Normocephalic and atraumatic.  Eyes: Conjunctivae, EOM and lids are normal.  Neck: Normal range of motion. Neck supple. No tracheal deviation present. No thyroid mass present.  Cardiovascular: Normal rate.   Pulmonary/Chest: Effort normal. No stridor.  Abdominal: Normal appearance and bowel sounds are normal. There is no CVA tenderness.  Musculoskeletal:       Left hand: She exhibits tenderness and swelling. She exhibits normal capillary refill, no deformity and no laceration. Decreased range of motion: due to pain. Normal sensation noted. Normal strength noted.       Hands: 2+ pulses bilaterally,Equal grips,Adequate circulation Tender on palpation and range of motion of the finger of the left hand.   Neurological: She is alert and oriented to person, place, and time. She has normal strength. No sensory deficit.  Skin: Skin is warm and dry.  Skin intact  Psychiatric: She has a normal mood and affect. Her speech is normal and behavior is normal.  Nursing note and vitals reviewed.   ED Course  Procedures (including critical care time)  DIAGNOSTIC STUDIES: Oxygen Saturation is 99% on room air, normal by  my interpretation.    COORDINATION OF CARE: 11:17 PM Discussed treatment plan with pt at bedside and pt agreed to plan.   Imaging Review Dg Hand Complete Left  09/24/2015   CLINICAL DATA:  Pain after hand caught in door  EXAM: LEFT HAND - COMPLETE 3+ VIEW  COMPARISON:  None.  FINDINGS: Frontal, oblique, and lateral views obtained. There is no fracture or dislocation. Joint spaces appear intact. No erosive change.  IMPRESSION: No fracture or dislocation.  No appreciable arthropathy.   Electronically Signed   By: Bretta Bang III M.D.   On: 09/24/2015 15:18   I have  personally reviewed and evaluated these images as part of my medical decision-making.   MDM  29 y.o. female with pain to the left hand s/p injury stable for d/c without neurovascular compromise. Fingers splinted, pain management and follow up with ortho as needed. Discussed with the patient and all questioned fully answered.   Final diagnoses:  Contusion of left hand including fingers, initial encounter   I personally performed the services described in this documentation, which was scribed in my presence. The recorded information has been reviewed and is accurate.   [address] [place], [place] 09/24/15 2324  Gwyneth Sprout, MD 09/26/15 [phone]

## 2015-09-24 NOTE — ED Notes (Signed)
Pt given ice pack and splinted middle and ring finger separately. Explained splinting to pt. Pt acknowledged.

## 2015-09-24 NOTE — ED Notes (Signed)
Declined W/C at D/C and was escorted to lobby by RN. 

## 2015-09-24 NOTE — ED Notes (Signed)
Pt called x2 in waiting room and sub waiting with no answer.

## 2015-09-24 NOTE — ED Notes (Signed)
Pt reports left hand and finger pain after smashing her hand in the car door.

## 2015-10-03 ENCOUNTER — Ambulatory Visit (INDEPENDENT_AMBULATORY_CARE_PROVIDER_SITE_OTHER): Payer: BLUE CROSS/BLUE SHIELD | Admitting: Family Medicine

## 2015-10-03 VITALS — BP 137/89 | HR 73 | Temp 97.9°F | Wt 201.4 lb

## 2015-10-03 DIAGNOSIS — S6991XA Unspecified injury of right wrist, hand and finger(s), initial encounter: Secondary | ICD-10-CM | POA: Insufficient documentation

## 2015-10-03 DIAGNOSIS — S6991XD Unspecified injury of right wrist, hand and finger(s), subsequent encounter: Secondary | ICD-10-CM

## 2015-10-03 NOTE — Progress Notes (Signed)
   Subjective:    Patient ID: [name], female    DOB: 1986-01-29, 29 y.o.   MRN: [id number]  Seen for Same day visit for   CC: Finger numbness - intermittent numbeness in dorsal aspect of 2nd and 3rd fingers of right hand that has occurred since she slammed her hand in car door Monday - No pain - numbness occurs for 10 minutes when she is using her hands a lot (packing for work) and resolves with rest - No swelling - no redness  Review of Systems   See HPI for ROS. Objective:  BP 137/89 mmHg  Pulse 73  Temp(Src) 97.9 F (36.6 C) (Oral)  Wt 201 lb 6.4 oz (91.354 kg)  LMP 10/02/2015 (Exact Date)  General: NAD Right hand 2nd & 3rd fingers: No swelling, erythema; FROM at MCP and PIP joint; no tenderness; Cap refill ~ 1 sec    Assessment & Plan:   Injury of right hand Intermittent numbness of injured fingers of right hand likely due to residual nerve irritation. No signs of vascular compromise. No pain or swelling. FROM in MCP and PIP joint - Reassured patient of likely temporary symptoms related to recent injury - Advised protect and rest of injured fingers as able - f/u if symtoms worsen or not resolved in 2-3 weeks

## 2015-10-03 NOTE — Assessment & Plan Note (Signed)
Intermittent numbness of injured fingers of right hand likely due to residual nerve irritation. No signs of vascular compromise. No pain or swelling. FROM in MCP and PIP joint - Reassured patient of likely temporary symptoms related to recent injury - Advised protect and rest of injured fingers as able - f/u if symtoms worsen or not resolved in 2-3 weeks

## 2015-10-10 ENCOUNTER — Encounter: Payer: No Typology Code available for payment source | Admitting: Family Medicine

## 2015-10-24 ENCOUNTER — Encounter: Payer: No Typology Code available for payment source | Admitting: Family Medicine

## 2016-05-06 ENCOUNTER — Encounter (HOSPITAL_COMMUNITY): Payer: Self-pay | Admitting: Emergency Medicine

## 2016-05-06 ENCOUNTER — Emergency Department (HOSPITAL_COMMUNITY)
Admission: EM | Admit: 2016-05-06 | Discharge: 2016-05-06 | Disposition: A | Discharge status: Home / Self Care | Payer: BLUE CROSS/BLUE SHIELD | Attending: Emergency Medicine | Admitting: Emergency Medicine

## 2016-05-06 DIAGNOSIS — F17200 Nicotine dependence, unspecified, uncomplicated: Secondary | ICD-10-CM | POA: Insufficient documentation

## 2016-05-06 DIAGNOSIS — K029 Dental caries, unspecified: Secondary | ICD-10-CM | POA: Insufficient documentation

## 2016-05-06 DIAGNOSIS — K0889 Other specified disorders of teeth and supporting structures: Secondary | ICD-10-CM | POA: Diagnosis present

## 2016-05-06 MED ORDER — OXYCODONE-ACETAMINOPHEN 5-325 MG PO TABS
1.0000 | ORAL_TABLET | ORAL | Status: DC | PRN
  Administered 2016-05-06: 1 via ORAL

## 2016-05-06 MED ORDER — TRAMADOL HCL 50 MG PO TABS: 50.0000 mg | ORAL_TABLET | Freq: Four times a day (QID) | ORAL | Status: DC | PRN

## 2016-05-06 MED ORDER — AMOXICILLIN 500 MG PO CAPS: 500.0000 mg | ORAL_CAPSULE | Freq: Three times a day (TID) | ORAL | Status: DC

## 2016-05-06 MED ORDER — OXYCODONE-ACETAMINOPHEN 5-325 MG PO TABS
ORAL_TABLET | ORAL | Status: AC
  Filled 2016-05-06: qty 1

## 2016-05-06 MED ORDER — NAPROXEN 500 MG PO TABS: 500.0000 mg | ORAL_TABLET | Freq: Two times a day (BID) | ORAL | Status: DC

## 2016-05-06 NOTE — ED Provider Notes (Signed)
CSN: [id number]     Arrival date & time 05/06/16  9562 History   First MD Initiated Contact with Patient 05/06/16 0559     Chief Complaint  Patient presents with  . Dental Pain     (Consider location/radiation/quality/duration/timing/severity/associated sxs/prior Treatment) HPI Comments: Patient presents with complaint of chronic left upper jaw pain became acutely worse awaking patient from sleep approximately 3 AM today. Patient is been taking over-the-counter medications without relief. Pain radiates to her left upper face. No neck swelling, difficulty swallowing or breathing. She denies facial or jaw swelling. She has a history of dental problems and this feels like her typical dental pain. Course is constant. Nothing makes symptoms better or worse.  Patient is a 30 y.o. female presenting with tooth pain. The history is provided by the patient.  Dental Pain Associated symptoms: no facial swelling, no fever, no headaches and no neck pain     History reviewed. No pertinent past medical history. Past Surgical History  Procedure Laterality Date  . Cyst removed     No family history on file. Social History  Substance Use Topics  . Smoking status: Current Every Day Smoker -- 0.50 packs/day  . Smokeless tobacco: Never Used  . Alcohol Use: No   OB History    No data available     Review of Systems  Constitutional: Negative for fever.  HENT: Positive for dental problem. Negative for ear pain, facial swelling, sore throat and trouble swallowing.   Respiratory: Negative for shortness of breath and stridor.   Musculoskeletal: Negative for neck pain.  Skin: Negative for color change.  Neurological: Negative for headaches.      Allergies  Review of patient's allergies indicates no known allergies.  Home Medications   Prior to Admission medications   Medication Sig Start Date End Date Taking? Authorizing Provider  acetaminophen (TYLENOL) 325 MG tablet Take 650 mg by mouth every  6 (six) hours as needed for mild pain.    Historical Provider, MD  amoxicillin (AMOXIL) 500 MG capsule Take 1 capsule (500 mg total) by mouth 3 (three) times daily. 05/06/16   Renne Crigler, PA-C  metroNIDAZOLE (FLAGYL) 500 MG tablet Take 1 tablet (500 mg total) by mouth 2 (two) times daily. 08/14/15   Devoria Albe, MD  naproxen (NAPROSYN) 500 MG tablet Take 1 tablet (500 mg total) by mouth 2 (two) times daily. 05/06/16   Renne Crigler, PA-C  traMADol (ULTRAM) 50 MG tablet Take 1 tablet (50 mg total) by mouth every 6 (six) hours as needed. 05/06/16   Renne Crigler, PA-C   BP 108/81 mmHg  Pulse 98  Temp(Src) 97.9 F (36.6 C) (Oral)  Resp 16  Ht  (1.676 m)  Wt 79.379 kg  BMI 28.26 kg/m2  SpO2 100%  LMP 04/05/2016 (Approximate)   Physical Exam  Constitutional: She appears well-developed and well-nourished.  HENT:  Head: Normocephalic and atraumatic.  Right Ear: Tympanic membrane, external ear and ear canal normal.  Left Ear: Tympanic membrane, external ear and ear canal normal.  Nose: Nose normal.  Mouth/Throat: Uvula is midline, oropharynx is clear and moist and mucous membranes are normal. No trismus in the jaw. Abnormal dentition. Dental caries present. No dental abscesses or uvula swelling. No tonsillar abscesses.  Patient with L maxillary tooth pain and tenderness to palpation in area of L premolars. One tooth is fractured. No swelling or erythema noted on exam. No palpable abscess.   Eyes: Conjunctivae are normal.  Neck: Normal range of motion.  Neck supple.  No neck swelling or Ludwig's angina  Lymphadenopathy:    She has no cervical adenopathy.  Neurological: She is alert.  Skin: Skin is warm and dry.  Psychiatric: She has a normal mood and affect.  Nursing note and vitals reviewed.   ED Course  Procedures (including critical care time)   6:48 AM Patient seen and examined.   Vital signs reviewed and are as follows: BP 108/81 mmHg  Pulse 98  Temp(Src) 97.9 F (36.6 C)  (Oral)  Resp 16  Ht 5\' 6"  (1.676 m)  Wt 79.379 kg  BMI 28.26 kg/m2  SpO2 100%  LMP 04/05/2016 (Approximate)  Patient counseled on use of narcotic pain medications. Counseled not to combine these medications with others containing tylenol. Urged not to drink alcohol, drive, or perform any other activities that requires focus while taking these medications. The patient verbalizes understanding and agrees with the plan.  Patient counseled to take prescribed medications as directed, return with worsening facial or neck swelling, and to follow-up with their dentist as soon as possible.    MDM   Final diagnoses:  Toothache   Patient with toothache. No fever. Exam unconcerning for Ludwig's angina or other deep tissue infection in neck.   Will treat pain. Encouraged patient to hold abx and fill abx with fever, facial swelling. Referrals given.     Renne CriglerJoshua Dulse Rutan, PA-C 05/06/16 [id number]  Gilda Creasehristopher J Pollina, MD 05/06/16 [phone]

## 2016-05-06 NOTE — Discharge Instructions (Signed)
Please read and follow all provided instructions.  Your diagnoses today include:  1. Toothache     The exam and treatment you received today has been provided on an emergency basis only. This is not a substitute for complete medical or dental care.  Tests performed today include:  Vital signs. See below for your results today.   Medications prescribed:   Naproxen - anti-inflammatory pain medication  Do not exceed  naproxen every 12 hours, take with food  You have been prescribed an anti-inflammatory medication or NSAID. Take with food. Take smallest effective dose for the shortest duration needed for your pain. Stop taking if you experience stomach pain or vomiting.    Tramadol - narcotic-like pain medication  DO NOT drive or perform any activities that require you to be awake and alert because this medicine can make you drowsy.    Amoxicillin - antibiotic  You have been prescribed an antibiotic medicine: take the entire course of medicine even if you are feeling better. Stopping early can cause the antibiotic not to work.  Take any prescribed medications only as directed.  Home care instructions:  Follow any educational materials contained in this packet.  Follow-up instructions: Please follow-up with your dentist for further evaluation of your symptoms.   Dental Assistance: See below for dental referrals  Return instructions:   Please return to the Emergency Department if you experience worsening symptoms.  Please return if you develop a fever, you develop more swelling in your face or neck, you have trouble breathing or swallowing food.  Please return if you have any other emergent concerns.  Additional Information:  Your vital signs today were: BP 108/81 mmHg   Pulse 98   Temp(Src) 97.9 F (36.6 C) (Oral)   Resp 16   Ht  (1.676 m)   Wt 79.379 kg   BMI 28.26 kg/m2   SpO2 100%   LMP 04/05/2016 (Approximate) If your blood pressure (BP) was elevated  above 135/85 this visit, please have this repeated by your doctor within one month. -------------- [employer] 211 is a great source of information about community services available.  Access by dialing 2-1-1 from anywhere in [place], or by website -  PooledIncome.pl.   Other Local Resources (Updated 12/2015)  Dental  Care   Services    Phone Number and Address  [facility] For children 69 - 37 years of age:   Cleaning  Tooth brushing/flossing instruction  Sealants, fillings, crowns  Extractions  Emergency treatment  [phone] [address] [place], [place] [address] Charges based on family income.  Medicaid and some insurance plans accepted.     [facility] - [facility], fillings, crowns  Extractions  Emergency treatment [phone] [address] [place], [place]  Pregnant women 55 years of age or older with a Medicaid card  [facility] - [place]  Cleaning  Sealants, fillings, crowns  Extractions  Emergency treatment [phone] [address] [place], [place] Pregnant women 21 years of age or older with a Medicaid card  [facility] Department of Health - [facility] For children 68 - 30 years of age:   Cleaning  Tooth brushing/flossing instruction  Sealants, fillings, crowns  Extractions  Emergency treatment Limited orthodontic services for patients with Medicaid [id number] [address] [place], [place] [address] Medicaid and [facility] Health Choice cover for  children up to age 28 and pregnant women.  Parents of children up to age 28 without Medicaid pay a reduced fee at time of service.  [facility] [place] For children 610 - 28 years of age:   Cleaning  Tooth brushing/flossing instruction  Sealants, fillings,  crowns  Extractions  Emergency treatment Limited orthodontic services for patients with Medicaid [phone] [address] [place], [place].  Medicaid and [place] Health Choice cover for children up to age 28 and pregnant women.  Parents of children up to age 28 without Medicaid pay a reduced fee.  [facility]  Cleaning  Sealants, fillings, crowns  Extractions  Hours: Tuesdays and Thursdays, 4:15 - 8 pm [phone] [address], [address] [place], [place] [address] Services free of charge to [facility] residents ages 18-64 who do not have health insurance, Medicare, [place], or [place] benefits and fall within federal poverty guidelines  [facility]    Provides dental care in addition to primary medical care, nutritional counseling, and pharmacy:  Nurse, mental healthCleaning  Sealants, fillings, crowns  Extractions                  [phone] [facility], [address] [place], [place]  [phone] [facility], [address] [place], [place]  [phone] [facility] [place], [place]  [phone] [facility], [address] [place], [place]  [phone] [facility] [address] [place], [place] Accepts [place], [place], most insurance.  Also provides services available to all with fees adjusted based on ability to pay.    [facility]  Cleaning  Tooth brushing/flossing instruction  Sealants, fillings, crowns  Extractions  Emergency treatment Hours: Tuesdays, Thursdays, and Fridays from 8 am to 5 pm by appointment only. [phone] [place], [place] [phone] [facility] residents with Medicaid (depending on eligibility) and children with [facility] Health Choice - call for more information.  [facility]  Extractions only  Hours: 2nd and  4th Thursday of each month from 6:30 am - 9 am.   [phone] [place], [place] [address] Ages 6118 and older only.  Patients are seen on a first come, first served basis.  [employer]  [employer]  Fillings  Extractions  Orthodontics  Endodontics  Implants/Crowns/Bridges  Complete and partial dentures [phone] [place],  Patients must complete an application for services.  There is often a waiting list.

## 2016-05-06 NOTE — ED Notes (Signed)
Pt. reports left upper molar pain onset midnight .

## 2016-06-09 ENCOUNTER — Encounter (HOSPITAL_COMMUNITY): Payer: Self-pay | Admitting: Emergency Medicine

## 2016-06-09 ENCOUNTER — Ambulatory Visit (HOSPITAL_COMMUNITY)
Admission: EM | Admit: 2016-06-09 | Discharge: 2016-06-09 | Disposition: A | Discharge status: Home / Self Care | Payer: BLUE CROSS/BLUE SHIELD | Attending: Family Medicine | Admitting: Family Medicine

## 2016-06-09 DIAGNOSIS — N760 Acute vaginitis: Secondary | ICD-10-CM | POA: Diagnosis not present

## 2016-06-09 DIAGNOSIS — N898 Other specified noninflammatory disorders of vagina: Secondary | ICD-10-CM | POA: Diagnosis present

## 2016-06-09 LAB — POCT URINALYSIS DIP (DEVICE)
BILIRUBIN URINE: NEGATIVE
Glucose, UA: NEGATIVE mg/dL
KETONES UR: NEGATIVE mg/dL
LEUKOCYTES UA: NEGATIVE
Nitrite: NEGATIVE
Protein, ur: NEGATIVE mg/dL
Specific Gravity, Urine: >=1.03 (ref 1.005–1.030)
Urobilinogen, UA: 0.2 mg/dL (ref 0.0–1.0)
pH: 6 (ref 5.0–8.0)

## 2016-06-09 LAB — POCT PREGNANCY, URINE: Preg Test, Ur: NEGATIVE

## 2016-06-09 MED ORDER — METRONIDAZOLE 500 MG PO TABS: 500.0000 mg | ORAL_TABLET | Freq: Two times a day (BID) | ORAL | Status: DC

## 2016-06-09 NOTE — ED Provider Notes (Signed)
CSN: [id number]     Arrival date & time 06/09/16  1942 History   None    Chief Complaint  Patient presents with  . Vaginal Discharge  . Vaginal Pain  . Dental Pain   (Consider location/radiation/quality/duration/timing/severity/associated sxs/prior Treatment) Patient is a 30 y.o. female presenting with vaginal discharge. The history is provided by the patient.  Vaginal Discharge Quality:  Malodorous Severity:  Mild Onset quality:  Gradual Duration:  1 week Progression:  Unchanged Chronicity:  New Context: spontaneously   Relieved by:  None tried Worsened by:  Nothing tried Ineffective treatments:  None tried Associated symptoms: vaginal itching   Associated symptoms: no fever, no urinary frequency and no urinary hesitancy     History reviewed. No pertinent past medical history. Past Surgical History  Procedure Laterality Date  . Cyst removed     History reviewed. No pertinent family history. Social History  Substance Use Topics  . Smoking status: Current Every Day Smoker -- 0.50 packs/day  . Smokeless tobacco: Never Used  . Alcohol Use: No   OB History    No data available     Review of Systems  Constitutional: Negative.  Negative for fever.  Gastrointestinal: Negative.   Genitourinary: Positive for vaginal discharge and vaginal pain. Negative for hesitancy, urgency, vaginal bleeding, genital sores, menstrual problem and pelvic pain.  Musculoskeletal: Negative.   All other systems reviewed and are negative.   Allergies  Review of patient's allergies indicates no known allergies.  Home Medications   Prior to Admission medications   Medication Sig Start Date End Date Taking? Authorizing Provider  naproxen (NAPROSYN) 500 MG tablet Take 1 tablet (500 mg total) by mouth 2 (two) times daily. 05/06/16  Yes Renne CriglerJoshua Geiple, PA-C  traMADol (ULTRAM) 50 MG tablet Take 1 tablet (50 mg total) by mouth every 6 (six) hours as needed. 05/06/16  Yes Renne CriglerJoshua Geiple, PA-C   acetaminophen (TYLENOL) 325 MG tablet Take 650 mg by mouth every 6 (six) hours as needed for mild pain.    Historical Provider, MD  amoxicillin (AMOXIL) 500 MG capsule Take 1 capsule (500 mg total) by mouth 3 (three) times daily. 05/06/16   Renne CriglerJoshua Geiple, PA-C  metroNIDAZOLE (FLAGYL) 500 MG tablet Take 1 tablet (500 mg total) by mouth 2 (two) times daily. 06/09/16   Linna HoffJames D Jeffrey Voth, MD   Meds Ordered and Administered this Visit  Medications - No data to display  BP 123/80 mmHg  Pulse 63  Temp(Src) 98.2 F (36.8 C) (Oral)  SpO2 100%  LMP 06/02/2016 (Exact Date) No data found.   Physical Exam  Constitutional: She is oriented to person, place, and time. She appears well-developed and well-nourished. No distress.  Abdominal: Soft. Bowel sounds are normal.  Genitourinary: Uterus normal. Cervix exhibits no motion tenderness and no discharge. Right adnexum displays no mass and no tenderness. Left adnexum displays no mass and no tenderness. No erythema or tenderness in the vagina. Vaginal discharge found.  Neurological: She is alert and oriented to person, place, and time.  Skin: Skin is warm and dry.  Nursing note and vitals reviewed.   ED Course  Procedures (including critical care time)  Labs Review Labs Reviewed  POCT URINALYSIS DIP (DEVICE) - Abnormal; Notable for the following:    Hgb urine dipstick SMALL (*)    All other components within normal limits  HIV ANTIBODY (ROUTINE TESTING)  POCT PREGNANCY, URINE  CERVICOVAGINAL ANCILLARY ONLY    Imaging Review No results found.   Visual Acuity Review  Right Eye Distance:   Left Eye Distance:   Bilateral Distance:    Right Eye Near:   Left Eye Near:    Bilateral Near:         MDM   1. Vaginitis        Linna Hoff, MD 06/09/16 2101

## 2016-06-09 NOTE — ED Notes (Signed)
[facility] a [facility] and pain x 1 week. The patient also complained on dental pain.

## 2016-06-10 LAB — CERVICOVAGINAL ANCILLARY ONLY
Chlamydia: NEGATIVE
NEISSERIA GONORRHEA: NEGATIVE
Wet Prep (BD Affirm): POSITIVE — AB

## 2016-06-10 LAB — HIV ANTIBODY (ROUTINE TESTING W REFLEX): HIV Screen 4th Generation wRfx: NONREACTIVE

## 2016-06-13 ENCOUNTER — Emergency Department (HOSPITAL_COMMUNITY)
Admission: EM | Admit: 2016-06-13 | Discharge: 2016-06-13 | Disposition: A | Discharge status: Home / Self Care | Payer: BLUE CROSS/BLUE SHIELD | Attending: Emergency Medicine | Admitting: Emergency Medicine

## 2016-06-13 ENCOUNTER — Encounter (HOSPITAL_COMMUNITY): Payer: Self-pay

## 2016-06-13 DIAGNOSIS — F17200 Nicotine dependence, unspecified, uncomplicated: Secondary | ICD-10-CM | POA: Insufficient documentation

## 2016-06-13 DIAGNOSIS — R51 Headache: Secondary | ICD-10-CM | POA: Diagnosis present

## 2016-06-13 DIAGNOSIS — R519 Headache, unspecified: Secondary | ICD-10-CM

## 2016-06-13 LAB — CBC WITH DIFFERENTIAL/PLATELET
Basophils Absolute: 0 10*3/uL (ref 0.0–0.1)
Basophils Relative: 0 %
EOS ABS: 0 10*3/uL (ref 0.0–0.7)
EOS PCT: 1 %
HCT: 35.6 % — ABNORMAL LOW (ref 36.0–46.0)
HEMOGLOBIN: 11.3 g/dL — AB (ref 12.0–15.0)
LYMPHS ABS: 0.4 10*3/uL — AB (ref 0.7–4.0)
Lymphocytes Relative: 12 %
MCH: 25.6 pg — AB (ref 26.0–34.0)
MCHC: 31.7 g/dL (ref 30.0–36.0)
MCV: 80.7 fL (ref 78.0–100.0)
MONOS PCT: 14 %
Monocytes Absolute: 0.5 10*3/uL (ref 0.1–1.0)
Neutro Abs: 2.4 10*3/uL (ref 1.7–7.7)
Neutrophils Relative %: 73 %
Platelets: 237 10*3/uL (ref 150–400)
RBC: 4.41 MIL/uL (ref 3.87–5.11)
RDW: 13.3 % (ref 11.5–15.5)
WBC: 3.3 10*3/uL — ABNORMAL LOW (ref 4.0–10.5)

## 2016-06-13 LAB — BASIC METABOLIC PANEL
Anion gap: 5 (ref 5–15)
BUN: 14 mg/dL (ref 6–20)
CO2: 24 mmol/L (ref 22–32)
Calcium: 9.4 mg/dL (ref 8.9–10.3)
Chloride: 109 mmol/L (ref 101–111)
Creatinine, Ser: 0.82 mg/dL (ref 0.44–1.00)
GFR calc Af Amer: >60 mL/min (ref >60)
GLUCOSE: 79 mg/dL (ref 65–99)
POTASSIUM: 3.7 mmol/L (ref 3.5–5.1)
Sodium: 138 mmol/L (ref 135–145)

## 2016-06-13 MED ORDER — KETOROLAC TROMETHAMINE 30 MG/ML IJ SOLN
30.0000 mg | Freq: Once | INTRAMUSCULAR | Status: AC
  Administered 2016-06-13: 30 mg via INTRAVENOUS
  Filled 2016-06-13: qty 1

## 2016-06-13 MED ORDER — PSEUDOEPH-BROMPHEN-DM 30-2-10 MG/5ML PO SYRP: 2.5000 mL | ORAL_SOLUTION | Freq: Four times a day (QID) | ORAL | Status: DC | PRN

## 2016-06-13 MED ORDER — FLUTICASONE PROPIONATE 50 MCG/ACT NA SUSP: 1.0000 | Freq: Every day | NASAL | Status: DC

## 2016-06-13 NOTE — ED Notes (Signed)
Pt presents with 3 day h/o frontal headache.  Pt taking multiple OTC medications without relief.  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  Pt reports no appetite, denies any nausea, +photophobia.

## 2016-06-13 NOTE — ED Notes (Signed)
Patient undressed, in gown, on monitor, continuous pulse oximetry and blood pressure cuff; visitor at bedside; warm blanket given 

## 2016-06-13 NOTE — ED Provider Notes (Signed)
CSN: [id number]     Arrival date & time 06/13/16  0865 History   First MD Initiated Contact with Patient 06/13/16 1013     Chief Complaint  Patient presents with  . Headache    HPI Comments: 30 year old female presents with headache and generalized malaise over the past 3 days. She states the headache is frontal, dull/achy. The headache came on gradually. She does have a history of headaches however she states this one is different. She has been taking OTC medicine for sinus with no relief. Reports associated decreased appetite and nasal congestion. Reports sick contacts. Denies fever, chills, syncope, trauma, tick bite, ear pain or drainage, sore throat, neck stiffness, chest pain, SOB, cough, abdominal pain, N/V/D.     The history is provided by the patient. No language interpreter was used.    History reviewed. No pertinent past medical history. Past Surgical History  Procedure Laterality Date  . Cyst removed     History reviewed. No pertinent family history. Social History  Substance Use Topics  . Smoking status: Current Every Day Smoker -- 0.50 packs/day  . Smokeless tobacco: Never Used  . Alcohol Use: No   OB History    No data available     Review of Systems  Constitutional: Positive for appetite change and fatigue. Negative for fever.  HENT: Positive for congestion and sinus pressure. Negative for ear discharge, ear pain, rhinorrhea, sneezing, sore throat and trouble swallowing.   Respiratory: Negative for shortness of breath.   Cardiovascular: Negative for chest pain.  Gastrointestinal: Negative for nausea, vomiting, abdominal pain and diarrhea.  Musculoskeletal: Negative for neck pain and neck stiffness.  Neurological: Positive for headaches.  All other systems reviewed and are negative.     Allergies  Review of patient's allergies indicates no known allergies.  Home Medications   Prior to Admission medications   Medication Sig Start Date End Date Taking?  Authorizing Provider  acetaminophen (TYLENOL) 325 MG tablet Take 650 mg by mouth every 6 (six) hours as needed for mild pain.    Historical Provider, MD  amoxicillin (AMOXIL) 500 MG capsule Take 1 capsule (500 mg total) by mouth 3 (three) times daily. 05/06/16   Renne Crigler, PA-C  metroNIDAZOLE (FLAGYL) 500 MG tablet Take 1 tablet (500 mg total) by mouth 2 (two) times daily. 06/09/16   Linna Hoff, MD  naproxen (NAPROSYN) 500 MG tablet Take 1 tablet (500 mg total) by mouth 2 (two) times daily. 05/06/16   Renne Crigler, PA-C  traMADol (ULTRAM) 50 MG tablet Take 1 tablet (50 mg total) by mouth every 6 (six) hours as needed. 05/06/16   Renne Crigler, PA-C   BP 126/74 mmHg  Pulse 84  Temp(Src) 98.5 F (36.9 C) (Oral)  Resp 18  SpO2 100%  LMP 04/30/2016 (Approximate)   Physical Exam  Constitutional: She is oriented to person, place, and time. She appears well-developed and well-nourished. No distress.  HENT:  Head: Normocephalic and atraumatic.  Right Ear: Hearing, tympanic membrane, external ear and ear canal normal.  Left Ear: Hearing, tympanic membrane, external ear and ear canal normal.  Nose: Mucosal edema and rhinorrhea present. Right sinus exhibits frontal sinus tenderness. Right sinus exhibits no maxillary sinus tenderness. Left sinus exhibits frontal sinus tenderness. Left sinus exhibits no maxillary sinus tenderness.  Mouth/Throat: Uvula is midline, oropharynx is clear and moist and mucous membranes are normal.  Eyes: Conjunctivae are normal. Pupils are equal, round, and reactive to light. Right eye exhibits no discharge.  Left eye exhibits no discharge. No scleral icterus.  Neck: Normal range of motion. Neck supple.  Cardiovascular: Normal rate.   Pulmonary/Chest: Effort normal. No respiratory distress.  Abdominal: She exhibits no distension.  Lymphadenopathy:    She has no cervical adenopathy.  Neurological: She is alert and oriented to person, place, and time.  CN II-XII grossly  intact  Skin: Skin is warm and dry.  Psychiatric: She has a normal mood and affect. Her behavior is normal.    ED Course  Procedures (including critical care time) Labs Review Labs Reviewed  CBC WITH DIFFERENTIAL/PLATELET - Abnormal; Notable for the following:    WBC 3.3 (*)    Hemoglobin 11.3 (*)    HCT 35.6 (*)    MCH 25.6 (*)    Lymphs Abs 0.4 (*)    All other components within normal limits  BASIC METABOLIC PANEL    Imaging Review No results found. I have personally reviewed and evaluated these images and lab results as part of my medical decision-making.   EKG Interpretation None      MDM   Final diagnoses:  Acute nonintractable headache, unspecified headache type   30 year old female presents with a frontal headache for the past 3 days with associated nasal congestion. Symptoms are consistent with sinus headache. Patient is afebrile, not tachycardic or tachypneic, normotensive, and not hypoxic. CBC remarkable for anemia which appears to be stable. White count is slightly low at 3.3. BMP is unremarkable. Toradol given and IV fluids with some improvement. Low suspicion for meningitis/encephalitis, SAH, hematoma as she is afebrile and no hx of trauma. With a prescription for decongestant and Flonase. Patient is NAD, non-toxic, with stable VS. Patient is informed of clinical course, understands medical decision making process, and agrees with plan. Opportunity for questions provided and all questions answered. Return precautions given.    Bethel BornKelly Marie Gekas, PA-C 06/13/16 1223  Bethel BornKelly Marie Gekas, PA-C 06/13/16 1224  Nelva Nayobert Beaton, MD 06/13/16 [phone]

## 2016-06-16 ENCOUNTER — Telehealth (HOSPITAL_COMMUNITY): Payer: Self-pay | Admitting: Emergency Medicine

## 2016-06-16 NOTE — ED Notes (Signed)
Called pt and notified of recent lab results from visit 6/12 Pt ID'd properly... Reports feeling better and sx have subsided and is taking Flagyl as prescribed.   Per Dr. Dayton ScrapeMurray,   Notes Recorded by Eustace MooreLaura W Murray, MD on 06/16/2016 at 8:32 AM Please let patient know that test for bacterial vaginosis was positive; rx [facility].  Tests for HIV, and for gonorrhea/chlamydia, were negative. Recheck as needed for persistent symptoms. LM  Adv pt if sx are not getting better to return  Pt verb understanding

## 2016-08-25 ENCOUNTER — Ambulatory Visit (HOSPITAL_COMMUNITY)
Admission: EM | Admit: 2016-08-25 | Discharge: 2016-08-25 | Disposition: A | Discharge status: Home / Self Care | Payer: BLUE CROSS/BLUE SHIELD | Attending: Family Medicine | Admitting: Family Medicine

## 2016-08-25 ENCOUNTER — Encounter (HOSPITAL_COMMUNITY): Payer: Self-pay | Admitting: *Deleted

## 2016-08-25 DIAGNOSIS — F17210 Nicotine dependence, cigarettes, uncomplicated: Secondary | ICD-10-CM | POA: Insufficient documentation

## 2016-08-25 DIAGNOSIS — G44209 Tension-type headache, unspecified, not intractable: Secondary | ICD-10-CM | POA: Diagnosis not present

## 2016-08-25 DIAGNOSIS — N898 Other specified noninflammatory disorders of vagina: Secondary | ICD-10-CM | POA: Insufficient documentation

## 2016-08-25 LAB — POCT URINALYSIS DIP (DEVICE)
BILIRUBIN URINE: NEGATIVE
Glucose, UA: NEGATIVE mg/dL
HGB URINE DIPSTICK: NEGATIVE
Ketones, ur: NEGATIVE mg/dL
LEUKOCYTES UA: NEGATIVE
NITRITE: NEGATIVE
PH: 7 (ref 5.0–8.0)
Protein, ur: NEGATIVE mg/dL
Specific Gravity, Urine: 1.02 (ref 1.005–1.030)
UROBILINOGEN UA: 1 mg/dL (ref 0.0–1.0)

## 2016-08-25 LAB — POCT PREGNANCY, URINE: PREG TEST UR: NEGATIVE

## 2016-08-25 MED ORDER — BUTALBITAL-APAP-CAFFEINE 50-325-40 MG PO TABS
1.0000 | ORAL_TABLET | Freq: Four times a day (QID) | ORAL | 0 refills | Status: DC | PRN
Start: 2016-08-25 — End: 2016-10-30

## 2016-08-25 MED ORDER — METRONIDAZOLE 500 MG PO TABS: 500.0000 mg | ORAL_TABLET | Freq: Two times a day (BID) | ORAL | 0 refills | Status: DC

## 2016-08-25 NOTE — ED Notes (Signed)
Phone   Number  Verified   Plan of  Can      Discussed       With pt

## 2016-08-25 NOTE — ED Triage Notes (Signed)
Pt  Reports   Symptoms of  Vaginal  Discharge   X   2  Days   With  Headache    For several   Weeks     Pt reports  Headache  Not  releived  By otc meds

## 2016-08-25 NOTE — ED Provider Notes (Signed)
CSN: [id number]     Arrival date & time 08/25/16  1351 History   None    Chief Complaint  Patient presents with  . Vaginal Discharge   (Consider location/radiation/quality/duration/timing/severity/associated sxs/prior Treatment) Patient c/o white vaginal discharge for 2 days.  She has had recent unprotected sex.  She is having a headache for last 2 weeks.  She does have some migraine features.   The history is provided by the patient.  Vaginal Discharge  Quality:  White Severity:  Moderate Onset quality:  Sudden Duration:  2 days Timing:  Constant Progression:  Worsening Chronicity:  New Context: after intercourse   Relieved by:  Nothing Worsened by:  Nothing Ineffective treatments:  None tried   History reviewed. No pertinent past medical history. Past Surgical History:  Procedure Laterality Date  . cyst removed     History reviewed. No pertinent family history. Social History  Substance Use Topics  . Smoking status: Current Every Day Smoker    Packs/day: 0.50  . Smokeless tobacco: Never Used  . Alcohol use No   OB History    Gravida Para Term Preterm AB Living   1             SAB TAB Ectopic Multiple Live Births                 Review of Systems  Constitutional: Negative.   Eyes: Negative.   Respiratory: Negative.   Cardiovascular: Negative.   Endocrine: Negative.   Genitourinary: Positive for vaginal discharge.  Musculoskeletal: Negative.   Skin: Negative.   Allergic/Immunologic: Negative.   Neurological: Positive for headaches.  Hematological: Negative.   Psychiatric/Behavioral: Negative.     Allergies  Review of patient's allergies indicates no known allergies.  Home Medications   Prior to Admission medications   Medication Sig Start Date End Date Taking? Authorizing Provider  acetaminophen (TYLENOL) 325 MG tablet Take 650 mg by mouth every 6 (six) hours as needed for mild pain.    Historical Provider, MD  amoxicillin (AMOXIL) 500 MG capsule  Take 1 capsule (500 mg total) by mouth 3 (three) times daily. 05/06/16   Renne CriglerJoshua Geiple, PA-C  brompheniramine-pseudoephedrine-DM 30-2-10 MG/5ML syrup Take 2.5 mLs by mouth 4 (four) times daily as needed. 06/13/16   Bethel BornKelly Marie Gekas, PA-C  butalbital-acetaminophen-caffeine (FIORICET) [phone] MG tablet Take 1-2 tablets by mouth every 6 (six) hours as needed for headache. 08/25/16 08/25/17  Deatra CanterWilliam J Herta Hink, FNP  fluticasone (FLONASE) 50 MCG/ACT nasal spray Place 1 spray into both nostrils daily. 06/13/16   Bethel BornKelly Marie Gekas, PA-C  metroNIDAZOLE (FLAGYL) 500 MG tablet Take 1 tablet (500 mg total) by mouth 2 (two) times daily. 06/09/16   Linna HoffJames D Kindl, MD  metroNIDAZOLE (FLAGYL) 500 MG tablet Take 1 tablet (500 mg total) by mouth 2 (two) times daily. 08/25/16   Deatra CanterWilliam J Bray Vickerman, FNP  naproxen (NAPROSYN) 500 MG tablet Take 1 tablet (500 mg total) by mouth 2 (two) times daily. 05/06/16   Renne CriglerJoshua Geiple, PA-C  traMADol (ULTRAM) 50 MG tablet Take 1 tablet (50 mg total) by mouth every 6 (six) hours as needed. 05/06/16   Renne CriglerJoshua Geiple, PA-C   Meds Ordered and Administered this Visit  Medications - No data to display  BP 129/95 (BP Location: Left Arm)   Pulse 68   Temp 97.8 F (36.6 C) (Oral)   Resp 12   LMP 07/29/2016 (Within Days)   SpO2 100%   Breastfeeding? Unknown  No data found.   Physical Exam  Constitutional: She is oriented to person, place, and time. She appears well-developed and well-nourished.  HENT:  Head: Normocephalic and atraumatic.  Eyes: Conjunctivae and EOM are normal. Pupils are equal, round, and reactive to light.  Neck: Normal range of motion. Neck supple.  Cardiovascular: Normal rate, regular rhythm and normal heart sounds.   Pulmonary/Chest: Effort normal and breath sounds normal.  Abdominal: Soft. Bowel sounds are normal.  Genitourinary:  Genitourinary Comments: Vagina with white DC Bus wnl Cervix - No CMT Adnexa - No mass and no tenderness  Neurological: She is alert and  oriented to person, place, and time. She has normal reflexes.  Skin: Skin is warm and dry.  Nursing note and vitals reviewed.   Urgent Care Course   Clinical Course    Procedures (including critical care time)  Labs Review Labs Reviewed  POCT URINALYSIS DIP (DEVICE)  POCT PREGNANCY, URINE  CERVICOVAGINAL ANCILLARY ONLY    Imaging Review No results found.   Visual Acuity Review  Right Eye Distance:   Left Eye Distance:   Bilateral Distance:    Right Eye Near:   Left Eye Near:    Bilateral Near:         MDM   1. Vaginal discharge   2. Tension headache    Flagyl 500mg  one po bid x 7 days #14 fioricet 50mg  one to two po q 6 hours prn #20  Endocervical cx GC/Chlamydia and wet prep sent off    Deatra Canter, FNP 08/25/16 1622

## 2016-08-26 LAB — CERVICOVAGINAL ANCILLARY ONLY
Chlamydia: NEGATIVE
Neisseria Gonorrhea: NEGATIVE
Wet Prep (BD Affirm): POSITIVE — AB

## 2016-09-03 ENCOUNTER — Telehealth (HOSPITAL_COMMUNITY): Payer: Self-pay | Admitting: Emergency Medicine

## 2016-09-03 NOTE — Telephone Encounter (Signed)
LM on pt's VM Need to see how pt is doing and to give lab results from recent visit on 8/28 Also let pt know labs can be obtained from MyChart

## 2016-09-03 NOTE — Telephone Encounter (Signed)
-----   Message from Eustace MooreLaura W Murray, MD sent at 08/27/2016  4:55 PM EDT ----- Please let patient know that test for gardnerella (bacterial vaginosis) was positive.   [facility]  Recheck or followup PCP for further evaluation if symptoms persist.  LM

## 2016-09-03 NOTE — Telephone Encounter (Signed)
Pt called back... lab results given Pt ID'd properly... Reports feeling better and sx have subsided Adv pt if sx are not getting better to return or to f/u w/PCP Education on safe sex given Pt verb understanding.

## 2016-10-24 ENCOUNTER — Ambulatory Visit (INDEPENDENT_AMBULATORY_CARE_PROVIDER_SITE_OTHER): Payer: BLUE CROSS/BLUE SHIELD | Admitting: Internal Medicine

## 2016-10-24 ENCOUNTER — Other Ambulatory Visit (HOSPITAL_COMMUNITY)
Admission: RE | Admit: 2016-10-24 | Discharge: 2016-10-24 | Disposition: A | Discharge status: Home / Self Care | Payer: BLUE CROSS/BLUE SHIELD | Source: Ambulatory Visit | Attending: Family Medicine | Admitting: Family Medicine

## 2016-10-24 VITALS — BP 121/85 | HR 75 | Temp 98.5°F | Wt 168.8 lb

## 2016-10-24 DIAGNOSIS — N898 Other specified noninflammatory disorders of vagina: Secondary | ICD-10-CM

## 2016-10-24 DIAGNOSIS — R300 Dysuria: Secondary | ICD-10-CM

## 2016-10-24 DIAGNOSIS — Z7251 High risk heterosexual behavior: Secondary | ICD-10-CM | POA: Diagnosis not present

## 2016-10-24 DIAGNOSIS — N760 Acute vaginitis: Secondary | ICD-10-CM | POA: Diagnosis not present

## 2016-10-24 DIAGNOSIS — Z113 Encounter for screening for infections with a predominantly sexual mode of transmission: Secondary | ICD-10-CM | POA: Diagnosis present

## 2016-10-24 DIAGNOSIS — B9689 Other specified bacterial agents as the cause of diseases classified elsewhere: Secondary | ICD-10-CM

## 2016-10-24 LAB — POCT URINALYSIS DIPSTICK
BILIRUBIN UA: NEGATIVE
Blood, UA: NEGATIVE
Glucose, UA: NEGATIVE
Ketones, UA: NEGATIVE
LEUKOCYTES UA: NEGATIVE
NITRITE UA: NEGATIVE
PH UA: 7
PROTEIN UA: NEGATIVE
Spec Grav, UA: 1.025
Urobilinogen, UA: 0.2

## 2016-10-24 LAB — POCT WET PREP (WET MOUNT)
Clue Cells Wet Prep Whiff POC: POSITIVE
Trichomonas Wet Prep HPF POC: ABSENT

## 2016-10-24 LAB — POCT URINE PREGNANCY: Preg Test, Ur: NEGATIVE

## 2016-10-24 MED ORDER — METRONIDAZOLE 0.75 % VA GEL: VAGINAL | 3 refills | Status: DC

## 2016-10-24 MED ORDER — METRONIDAZOLE 500 MG PO TABS: 500.0000 mg | ORAL_TABLET | Freq: Two times a day (BID) | ORAL | 0 refills | Status: DC

## 2016-10-24 NOTE — Progress Notes (Signed)
[facility] Progress Note  Subjective:  [name] is a 30 y.o. female who presents for concern for increased vaginal discharge. Symptoms have been present x 2 days. She denies any odor. She has not had itching but endorses occasional lower abdominal pain since yesterday. She has 1 female sexual partner. They do not use condoms, and she is not on birth control. She had trichomoniasis 2 years ago. She last had her period on the 10th. She denies any new soaps or detergents and does not douche. She does smoke. She has had documented BV at least twice this year.  ROS: No dyspareunia, no n/v/d, no fevers  No Known Allergies  Objective: Blood pressure 121/85, pulse 75, temperature 98.5 F (36.9 C), weight 168 lb 12.8 oz (76.6 kg), SpO2 100 % Body mass index is 27.25 kg/m. Constitutional: Overweight female, in NAD  Abdominal: Soft. +BS, NT, ND, no rebound or guarding.  GU: No lesions noted on speculum exam. Moderate amount of thick white discharge noted. No cervical motion tenderness on bimanual exam.  Skin: Skin is warm and dry. No rash noted on exposed skin. No erythema.  Vitals reviewed  Assessment/Plan: Vaginal discharge - UA not c/w UTI. Pregnancy test negative. Low suspicion for PID given lack of fever and cervical motion tenderness. Wet prep positive for BV.  - Although this is only patient's 3rd confirmed episode of BV this year, she thinks she may have had other episodes and is interested in longer treatment course. Prescribed PO metronidazole to be followed by metro gel twice weekly for 4 to 6 months. Counseled that quitting smoking could help decrease recurrence.  - Obtained HIV, RPR and gc/chlamydia testing per patient request.   Follow-up as needed.  Dani GobbleHillary Kerri Kovacik, MD [facility], PGY-2

## 2016-10-24 NOTE — Patient Instructions (Signed)
Ms. Tiburcio PeaHarris,  I will call you with the results of your urine test and vaginal swab this evening. If it is BV, we can discuss a longer treatment strategy.  Best, Dr. Sampson GoonFitzgerald

## 2016-10-25 LAB — HIV ANTIBODY (ROUTINE TESTING W REFLEX): HIV 1&2 Ab, 4th Generation: NONREACTIVE

## 2016-10-25 LAB — RPR

## 2016-10-26 ENCOUNTER — Encounter: Payer: Self-pay | Admitting: Internal Medicine

## 2016-10-26 DIAGNOSIS — N898 Other specified noninflammatory disorders of vagina: Secondary | ICD-10-CM | POA: Insufficient documentation

## 2016-10-26 NOTE — Assessment & Plan Note (Signed)
-   UA not c/w UTI. Pregnancy test negative. Low suspicion for PID given lack of fever and cervical motion tenderness. Wet prep positive for BV.  - Although this is only patient's 3rd confirmed episode of BV this year, she thinks she may have had other episodes and is interested in longer treatment course. Prescribed PO metronidazole to be followed by metro gel twice weekly for 4 to 6 months. Counseled that quitting smoking could help decrease recurrence.  - Obtained HIV, RPR and gc/chlamydia testing per patient request.

## 2016-10-27 LAB — GC/CHLAMYDIA PROBE AMP (~~LOC~~) NOT AT ARMC
CHLAMYDIA, DNA PROBE: NEGATIVE
Neisseria Gonorrhea: NEGATIVE

## 2016-10-30 ENCOUNTER — Encounter: Payer: Self-pay | Admitting: Family Medicine

## 2016-10-30 ENCOUNTER — Ambulatory Visit (INDEPENDENT_AMBULATORY_CARE_PROVIDER_SITE_OTHER): Payer: BLUE CROSS/BLUE SHIELD | Admitting: Family Medicine

## 2016-10-30 ENCOUNTER — Encounter: Payer: Self-pay | Admitting: Psychology

## 2016-10-30 VITALS — BP 108/79 | HR 76 | Temp 98.2°F | Ht 66.0 in | Wt 167.4 lb

## 2016-10-30 DIAGNOSIS — N3000 Acute cystitis without hematuria: Secondary | ICD-10-CM | POA: Diagnosis not present

## 2016-10-30 DIAGNOSIS — Z23 Encounter for immunization: Secondary | ICD-10-CM

## 2016-10-30 DIAGNOSIS — Z87891 Personal history of nicotine dependence: Secondary | ICD-10-CM | POA: Diagnosis not present

## 2016-10-30 DIAGNOSIS — F329 Major depressive disorder, single episode, unspecified: Secondary | ICD-10-CM

## 2016-10-30 DIAGNOSIS — R3589 Other polyuria: Secondary | ICD-10-CM

## 2016-10-30 DIAGNOSIS — N390 Urinary tract infection, site not specified: Secondary | ICD-10-CM | POA: Insufficient documentation

## 2016-10-30 DIAGNOSIS — F32A Depression, unspecified: Secondary | ICD-10-CM

## 2016-10-30 DIAGNOSIS — Z0000 Encounter for general adult medical examination without abnormal findings: Secondary | ICD-10-CM | POA: Diagnosis not present

## 2016-10-30 DIAGNOSIS — R358 Other polyuria: Secondary | ICD-10-CM

## 2016-10-30 LAB — POCT URINALYSIS DIPSTICK
Bilirubin, UA: NEGATIVE
Glucose, UA: NEGATIVE
Ketones, UA: NEGATIVE
NITRITE UA: NEGATIVE
PH UA: 5.5
RBC UA: NEGATIVE
Spec Grav, UA: >=1.03
UROBILINOGEN UA: 0.2

## 2016-10-30 LAB — POCT UA - MICROSCOPIC ONLY

## 2016-10-30 MED ORDER — AMOXICILLIN 500 MG PO CAPS: 500.0000 mg | ORAL_CAPSULE | Freq: Three times a day (TID) | ORAL | 0 refills | Status: DC

## 2016-10-30 NOTE — Assessment & Plan Note (Signed)
Involved integrated care.

## 2016-10-30 NOTE — Patient Instructions (Addendum)
Per Dr. Pascal LuxKane: So glad you are asking for help.  I gave you the brochure for [facility] [place].  I will call you in a week to see if you got in touch with them.  Otherwise, feel free to call me sooner at [phone] if you are having difficulty.    Per Hensel I sent in a prescription for antibiotics for your urine infection. Great job on stopping smoking Decide what you want to do so you don't get pregnant.

## 2016-10-30 NOTE — Progress Notes (Signed)
   Subjective:    Patient ID: Seabron SpatesWanaqui H Dinneen, female    DOB: 12/20/1986, 30 y.o.   MRN: [id number]  HPI   Annual physical.  Plus medical problems. Ms Tiburcio PeaHarris has a stressful life.  She is a single mom currently working two low paying jobs and raising an 28 yo son largely on her own.  Father is marginally involved and not a particularly positive influence.   Nicely, she has no bad health habits.  She is keeping her weight in control.  Does not exercise but her daily work is physical.   Major risk factor is that she does not reliably use contraception and not always safe sex.   She did quit smoking two months ago.  Does not use alcohol. HPDP will get flu shot and PAP is up to date.   Issues 1. Depression.  Crying frequently about life situation.  Integrative care involved.  Seen Dr. Pascal LuxKane note.  No HI or SI.  Wants counseling.  Does not want meds now. 2. Recurrent BV under treatment.  No STDs.  3. Inadaquate contraception 4. Some dysuria for 3 days.  No fever or flank pain    Review of Systems Denies CP, SOB, change in weight bowel or bladder.  No bleeding.     Objective:   Physical Exam HEENT normal Lungs clear Cardiac RRR without m or g Abd benign Pelvic deferred since just done  Ext WNL Neuro WNL.       Assessment & Plan:

## 2016-10-30 NOTE — Assessment & Plan Note (Signed)
Since cocci, will rx with amox and culture

## 2016-10-30 NOTE — Assessment & Plan Note (Signed)
Delighted with weight and quit smoking.

## 2016-10-30 NOTE — Assessment & Plan Note (Signed)
Quit Sept 2017

## 2016-10-30 NOTE — Assessment & Plan Note (Signed)
Did not do a full assessment today.  PHQ-9 is 14 with somewhat difficult noted.  Tearful in the room.  Reports she is tired.  Function appears to be fairly good if she is able to work as many hours as she describes.  I would think that work schedule would be contributing to her mood.  In addition, grief regarding her mother may still be playing a role.    Of note, I asked about suicidal and homicidal thoughts.  She said she occasionally had them but would not act on them citing her son as a barrier.  She has, at times, had the thought that her son would be better off if she weren't here.  Discussed that and a plan if her thoughts were more persistent or she considered acting on them.  No active SI reported at this time.

## 2016-10-30 NOTE — Addendum Note (Signed)
Addended by: Jennette BillBUSICK, ROBERT L on: 10/30/2016 11:32 AM   Modules accepted: Orders

## 2016-10-30 NOTE — Progress Notes (Signed)
Letter created for insurance stating pt had annual physical. Lamonte SakaiZimmerman Rumple, April D, CMA

## 2016-10-30 NOTE — Progress Notes (Signed)
Dr. Leveda AnnaHensel requested a Behavioral Health Consult.   Presenting Issue:  Patient is a single mom, working a job she does not like, and feeling depressed.  Tearful regularly.  Is interested in counseling.    Due to a limited time frame, a brief history was gathered and a patient-centered recommendation was developed.  Report of symptoms:  Crying all of the time, tired.    Duration of CURRENT symptoms:  Did not assess.  Age of onset of first mood disturbance:  Patient reported she first had trouble with her mood 6-7 years ago when her mom died of Lupus.    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  She attends football games on Saturdays and tries to get things done around the house on Sunday.  I do not know that she is missing work secondary to her symptoms.    Psychiatric History - Diagnoses: "Depression."  Not sure how fully this was assessed.   - Hospitalizations: Did not assess. - Pharmacotherapy: Has been tried on medicine in the past but she doesn't think it helped.   - Outpatient therapy: Is interested.    Family history of psychiatric issues:  Did not assess.  Current and history of substance use:  Did not assess.  PHQ-9:  14.  Somewhat difficult.  Assessment / Plan / Recommendations: She is most interested in counseling and states that with an appointment, she can leave work.  Agreed that a [place] location would make the most sense.  [facility]ave [facility] [place] [facility].  I plan to call in a week to see if she got connected.

## 2016-10-30 NOTE — Assessment & Plan Note (Signed)
She will consider options and decide

## 2016-11-02 LAB — URINE CULTURE

## 2016-11-07 ENCOUNTER — Telehealth: Payer: Self-pay | Admitting: Psychology

## 2016-11-07 NOTE — Progress Notes (Signed)
Called patient to follow-up from a warm hand-off last week.  Left a VM.

## 2017-04-03 ENCOUNTER — Ambulatory Visit (INDEPENDENT_AMBULATORY_CARE_PROVIDER_SITE_OTHER): Payer: Medicaid Other | Admitting: Family Medicine

## 2017-04-03 ENCOUNTER — Encounter: Payer: Self-pay | Admitting: Family Medicine

## 2017-04-03 ENCOUNTER — Other Ambulatory Visit (HOSPITAL_COMMUNITY)
Admission: RE | Admit: 2017-04-03 | Discharge: 2017-04-03 | Disposition: A | Discharge status: Home / Self Care | Payer: Medicaid Other | Source: Ambulatory Visit | Attending: Family Medicine | Admitting: Family Medicine

## 2017-04-03 VITALS — BP 120/82 | HR 70 | Temp 98.2°F | Ht 66.0 in | Wt 177.0 lb

## 2017-04-03 DIAGNOSIS — Z7251 High risk heterosexual behavior: Secondary | ICD-10-CM

## 2017-04-03 DIAGNOSIS — Z124 Encounter for screening for malignant neoplasm of cervix: Secondary | ICD-10-CM

## 2017-04-03 DIAGNOSIS — R87810 Cervical high risk human papillomavirus (HPV) DNA test positive: Secondary | ICD-10-CM | POA: Diagnosis not present

## 2017-04-03 DIAGNOSIS — N898 Other specified noninflammatory disorders of vagina: Secondary | ICD-10-CM

## 2017-04-03 DIAGNOSIS — N870 Mild cervical dysplasia: Secondary | ICD-10-CM | POA: Diagnosis not present

## 2017-04-03 LAB — POCT WET PREP (WET MOUNT)
CLUE CELLS WET PREP WHIFF POC: NEGATIVE
TRICHOMONAS WET PREP HPF POC: ABSENT

## 2017-04-03 LAB — POCT URINE PREGNANCY: Preg Test, Ur: NEGATIVE

## 2017-04-03 MED ORDER — FLUCONAZOLE 150 MG PO TABS: 150.0000 mg | ORAL_TABLET | Freq: Once | ORAL | 0 refills | Status: AC

## 2017-04-03 NOTE — Patient Instructions (Addendum)
I will contact you will the results of your labs.  If anything is abnormal, I will call you.  Otherwise, expect a copy to be mailed to you.  Healthy vaginal hygiene practices   -  Avoid sleeper pajamas. Nightgowns allow air to circulate.  Sleep without underpants whenever possible.  -  Wear cotton underpants during the day. Double-rinse underwear after washing to avoid residual irritants. Do not use fabric softeners for underwear and swimsuits.  - Avoid tights, leotards, leggings, "skinny" jeans, and other tight-fitting clothing. Skirts and loose-fitting pants allow air to circulate.  - Avoid pantyliners.  Instead use tampons or cotton pads.  - Daily warm bathing is helpful:     - Soak in clean water (no soap) for 10 to 15 minutes. Adding vinegar or baking soda to the water has not been specifically studied and may not be better than clean water alone.      - Use soap to wash regions other than the genital area just before getting out of the tub. Limit use of any soap on genital areas. Use fragance-free soaps.     - Rinse the genital area well and gently pat dry.  Don't rub.  Hair dryer to assist with drying can be used only if on cool setting.     - Do not use bubble baths or perfumed soaps.  - Do not use any feminine sprays, douches or powders.  These contain chemicals that will irritate the skin.  - If the genital area is tender or swollen, cool compresses may relieve the discomfort. Unscented wet wipes can be used instead of toilet paper for wiping.   - Emollients, such as Vaseline, may help protect skin and can be applied to the irritated area.  - Always remember to wipe front-to-back after bowel movements. Pat dry after urination.  - Do not sit in wet swimsuits for long periods of time after swimming   

## 2017-04-03 NOTE — Progress Notes (Deleted)
   Subjective: CC:*** WUJ:WJXBJYN H Klett is a 31 y.o. female presenting to clinic today for same day appointment. PCP: Sanjuana Letters, MD Concerns today include:  1. ***  No Known Allergies  Social Hx reviewed: *** smoker. MedHx, current medications and allergies reviewed.  Please see EMR.  Health Maintenance: *** Flu Vaccine needed: {YES/NO/WILD CARDS:18581}  Tdap Vaccine needed: {YES/NO/WILD CARDS:18581}  - every 33yrs - (<3 lifetime doses or unknown): all wounds -- look up need for Tetanus IG - (>=3 lifetime doses): clean/minor wound if >58yrs from previous; all other wounds if >75yrs from previous Zoster Vaccine needed: {YES/NO/WILD CARDS:18581} (those >50yo, once) Pneumonia Vaccine needed: {YES/NO/WILD CARDS:18581} (those w/ risk factors) - (<78yr) Both: Immunocompromised, cochlear implant, CSF leak, asplenic, sickle cell, Chronic Renal Failure - (<77yr) PPSV-23 only: Heart dz, lung disease, DM, tobacco abuse, alcoholism, cirrhosis/liver disease. - (>68yr): PPSV13 then PPSV23 in 6-12mths;  - (>48yr): repeat PPSV23 once if pt received prior to 29yo and 43yrs have passed   ROS: Per HPI  Objective: Office vital signs reviewed. BP 120/82   Pulse 70   Temp 98.2 F (36.8 C) (Oral)   Ht  (1.676 m)   Wt 177 lb (80.3 kg)   LMP 03/04/2017   SpO2 98%   BMI 28.57 kg/m   Physical Examination:  General: Awake, alert, *** nourished, No acute distress HEENT: Normal    Neck: No masses palpated. No lymphadenopathy    Ears: Tympanic membranes intact, normal light reflex, no erythema, no bulging    Eyes: PERRLA, EOMI, sclera ***    Nose: nasal turbinates moist, *** nasal discharge    Throat: moist mucus membranes, no erythema, *** tonsillar exudate.  Airway is patent Cardio: regular rate and rhythm, S1S2 heard, no murmurs appreciated Pulm: clear to auscultation bilaterally, no wheezes, rhonchi or rales; normal work of breathing on room air GI: soft, non-tender,  non-distended, bowel sounds present x4, no hepatomegaly, no splenomegaly, no masses GU: external vaginal tissue ***, cervix ***, *** punctate lesions on cervix appreciated, *** discharge from cervical os, *** bleeding, *** cervical motion tenderness, *** abdominal/ adnexal masses Extremities: warm, well perfused, No edema, cyanosis or clubbing; +*** pulses bilaterally MSK: *** gait and *** station Skin: dry; intact; no rashes or lesions Neuro: *** Strength and light touch sensation grossly intact, *** DTRs ***/4  Assessment/ Plan: 31 y.o. female   ***  Tyon Cerasoli Hulen Skains, DO PGY-3, [facility]

## 2017-04-03 NOTE — Progress Notes (Signed)
   Subjective: CC: yeast infection [name] is a 31 y.o. female presenting to clinic today for same day appointment. PCP: Sanjuana Letters, MD Concerns today include:  1. Vaginal Discharge Patient reports that discharge started Saturday.  She notes that discharge initially appeared white and chunky.  She denies vaginal odors.  She endorses vaginal pruritis.  Denies abnormal vaginal bleeding, dysuria, hematuria, pelvic pain, nausea, vomiting, fevers.   She has used Monistat with little relief.   No recent abx use.  Positive history of STIs, trichomonas and chlamydia.  She is sexually active and does not use condoms.  Contraception: none.  Patient's last menstrual period is now.  She started today.  No Known Allergies  Social Hx reviewed. MedHx, current medications and allergies reviewed.  Please see EMR. ROS: Per HPI  Objective: Office vital signs reviewed. BP 120/82   Pulse 70   Temp 98.2 F (36.8 C) (Oral)   Ht  (1.676 m)   Wt 177 lb (80.3 kg)   LMP 03/04/2017   SpO2 98%   BMI 28.57 kg/m   Physical Examination:  General: Awake, alert, well nourished, No acute distress GU: external vaginal tissue normal, no lesions, cervix midline, no punctate lesions on cervix appreciated, moderate cheesy discharge from cervical os mixed with blood, no cervical motion tenderness, no abdominal/ adnexal masses  Results for orders placed or performed in visit on 04/03/17 (from the past 24 hour(s))  POCT Wet Prep [facility])     Status: Abnormal   Collection Time: 04/03/17  4:45 PM  Result Value Ref Range   Source Wet Prep POC VAG    WBC, Wet Prep HPF POC 0-3    Bacteria Wet Prep HPF POC Moderate (A) Few   Clue Cells Wet Prep HPF POC None None   Clue Cells Wet Prep Whiff POC Negative Whiff    Yeast Wet Prep HPF POC None    Trichomonas Wet Prep HPF POC Absent Absent  POCT urine pregnancy     Status: None   Collection Time: 04/03/17  4:45 PM  Result Value Ref Range   Preg Test, Ur Negative Negative   Assessment/ Plan: 31 y.o. female   1. Vaginal discharge.  No yeast seen on wet prep but clinically appears to be yeast infection.  Wet prep with no clue cells to suggest BV.  Moderate bacteria though, so STI testing sent.   - POCT Wet Prep [facility]) - Cervicovaginal ancillary only GC/CT Meds ordered this encounter  Medications  . fluconazole (DIFLUCAN) 150 MG tablet    Sig: Take 1 tablet (150 mg total) by mouth once. May repeat in 3 days if symptoms persist    Dispense:  2 tablet    Refill:  0   2. Screening for cervical cancer.  I offered patient pap smear since we were doing a pelvic exam.  It appears she has a h/o LSIL in 2011 and HPV positive in 2015.  Has not had pap since.  Reviewed increased risk for cervical cancer given HPV status.  Pap obtained with cotesting today.  May need colposcopy - Cytology - PAP w/ trichomonas testing and HPV cotesting  3. Unprotected sex.  Upreg neg - Encouraged safe sex practices. - POCT urine pregnancy  Follow up with PCP prn  Raliegh Ip, DO PGY-3, [facility]

## 2017-04-07 LAB — CERVICOVAGINAL ANCILLARY ONLY
Chlamydia: NEGATIVE
Neisseria Gonorrhea: NEGATIVE

## 2017-04-08 LAB — CYTOLOGY - PAP
ADEQUACY: ABSENT — AB
HPV: DETECTED — AB
Trichomonas: NEGATIVE

## 2017-04-09 ENCOUNTER — Encounter: Payer: Self-pay | Admitting: Family Medicine

## 2017-04-09 ENCOUNTER — Telehealth: Payer: Self-pay | Admitting: Family Medicine

## 2017-04-09 NOTE — Telephone Encounter (Signed)
Attempted to call patient regarding pap smear results, which showed LSIL/ HPV+.  She will need a colposcopy to further evaluate this.  Unfortunately there was no answer and no VM.  I will ask our CMAs to attempt to reach out to her again to make an appointment for colpo clinic.

## 2017-04-14 NOTE — Telephone Encounter (Signed)
Attempted to call again.  LMOVM for pt to call back.  Dr. Nadine Counts, would you like to mail a letter as well? Milas Gain, Maryjo Rochester, CMA

## 2017-04-14 NOTE — Telephone Encounter (Signed)
Letter was created on 4/12 and sent 4/13. Jaylyn Iyer, Maryjo Rochester, CMA

## 2017-04-14 NOTE — Telephone Encounter (Signed)
Yes please

## 2017-05-07 ENCOUNTER — Ambulatory Visit (INDEPENDENT_AMBULATORY_CARE_PROVIDER_SITE_OTHER): Payer: Medicaid Other | Admitting: Family Medicine

## 2017-05-07 VITALS — BP 122/80 | Temp 98.4°F | Ht 66.0 in | Wt 178.0 lb

## 2017-05-07 DIAGNOSIS — R87612 Low grade squamous intraepithelial lesion on cytologic smear of cervix (LGSIL): Secondary | ICD-10-CM | POA: Diagnosis present

## 2017-05-07 LAB — POCT URINE PREGNANCY: Preg Test, Ur: NEGATIVE

## 2017-05-07 NOTE — Patient Instructions (Signed)

## 2017-05-07 NOTE — Progress Notes (Signed)
Patient ID: [name], female   DOB: 08/18/1986, 31 y.o.   MRN: [id number]  Chief Complaint  Patient presents with  . Colposcopy    PAP LSIL +HPV    HPI [name] is a 31 y.o. female. Here for colpo HPI  Indications: Pap smear on April 2018 showed: low-grade squamous intraepithelial neoplasia (LGSIL - encompassing HPV,mild dysplasia,CIN I). Previous colposcopy: N/A and in NA. Prior cervical treatment: no treatment.  No past medical history on file.  Past Surgical History:  Procedure Laterality Date  . cyst removed      No family history on file.  Social History Social History  Substance Use Topics  . Smoking status: Current Every Day Smoker    Packs/day: 0.50  . Smokeless tobacco: Never Used  . Alcohol use No    No Known Allergies  No current outpatient prescriptions on file.   No current facility-administered medications for this visit.     Review of Systems Review of Systems  Genitourinary:       Abnormal PAP  All other systems reviewed and are negative.   Blood pressure 122/80, height 5\' 6"  (1.676 m), weight 178 lb (80.7 kg), last menstrual period 05/02/2017, unknown if currently breastfeeding.  Physical Exam Physical Exam  Constitutional: She is oriented to person, place, and time. She appears well-developed. No distress.  Pulmonary/Chest: Effort normal.  Genitourinary: Vagina normal. No labial fusion. There is no lesion or injury on the right labia. There is no lesion or injury on the left labia. Cervix exhibits no discharge and no friability.  Genitourinary Comments: Transformation zone adequate, no abnormal vessels, no acetowhite lesion or abnormal lugol iodine pick up.  Neurological: She is alert and oriented to person, place, and time.  Nursing note and vitals reviewed.     Data Reviewed 05/07/17  Assessment    Procedure Details  The risks and benefits of the procedure and Written informed consent obtained.  Speculum placed in  vagina and excellent visualization of cervix achieved, cervix swabbed x 3 with acetic acid solution. Lugol's iodine applied.  Specimens: ECC obtained.  Complications: none.     Plan    Specimens labelled and sent to Pathology. Return to discuss Pathology results in 2 weeks.      Janit PaganIOLA, Nicholi Ghuman 05/07/2017, 9:32 AM

## 2017-05-11 ENCOUNTER — Telehealth: Payer: Self-pay | Admitting: Family Medicine

## 2017-05-11 DIAGNOSIS — D069 Carcinoma in situ of cervix, unspecified: Secondary | ICD-10-CM

## 2017-05-11 DIAGNOSIS — R87613 High grade squamous intraepithelial lesion on cytologic smear of cervix (HGSIL): Secondary | ICD-10-CM

## 2017-05-11 NOTE — Telephone Encounter (Signed)
Abnormal colpo result discussed with patient. She will need referral to Gyn-Onc. All questions were answered. F/U with PCP for routine health care.

## 2017-05-13 ENCOUNTER — Telehealth: Payer: Self-pay | Admitting: Family Medicine

## 2017-05-13 ENCOUNTER — Telehealth: Payer: Self-pay | Admitting: Gynecologic Oncology

## 2017-05-13 ENCOUNTER — Encounter: Payer: Self-pay | Admitting: Gynecologic Oncology

## 2017-05-13 NOTE — Telephone Encounter (Signed)
Appt has been scheduled for the pt to see Dr. Andrey Farmerossi on 5/30 at 930am. Sent a msg to Tia from Dr. Phebe CollaEniola's office to inform her of the appt and to contact the pt.

## 2017-05-13 NOTE — Telephone Encounter (Signed)
LMOVM for patient to return call. Patient has been scheduled to see Dr. Adolphus BirchwoodEmma Ford on 05/27/2017  at  9:30 AM. [facility] appointment [facility] at [facility].  [address], [place], [place] [address] [phone]

## 2017-05-19 NOTE — Telephone Encounter (Signed)
Attempted to call patient again, still no answer. Letter now has been mailed.

## 2017-05-27 ENCOUNTER — Encounter: Payer: Self-pay | Admitting: Gynecologic Oncology

## 2017-05-27 ENCOUNTER — Ambulatory Visit: Payer: Medicaid Other | Attending: Gynecologic Oncology | Admitting: Gynecologic Oncology

## 2017-05-27 VITALS — BP 126/77 | HR 65 | Temp 99.2°F | Resp 18 | Ht 65.83 in | Wt 180.7 lb

## 2017-05-27 DIAGNOSIS — F17210 Nicotine dependence, cigarettes, uncomplicated: Secondary | ICD-10-CM | POA: Diagnosis not present

## 2017-05-27 DIAGNOSIS — D060 Carcinoma in situ of endocervix: Secondary | ICD-10-CM | POA: Diagnosis not present

## 2017-05-27 DIAGNOSIS — R87810 Cervical high risk human papillomavirus (HPV) DNA test positive: Secondary | ICD-10-CM | POA: Diagnosis not present

## 2017-05-27 NOTE — Patient Instructions (Addendum)
You will be [facility] on June 19 at 10:15.  You will receive a phone call from the presurgical nurse to discuss your procedure several days before.    LEEP POST-PROCEDURE INSTRUCTIONS  1. You may take Ibuprofen, Aleve or Tylenol for pain if needed.  Cramping is normal.  2. You will have black and/or bloody discharge at first.  This will lighten and then turn clear before completely resolving.  This will take 2 to 3 weeks.  3. Put nothing in your vagina until the bleeding or discharge stops (usually 2 or3 days).  4. You need to call if you have redness around the biopsy site, if there is any unusual draining, if the bleeding is heavy, or if you are concerned.  5. Shower or bathe as normal  6. We will call you within one week with results or we will discuss the results at your follow-up appointment if needed.  7. You will need to return for a follow-up Pap smear as directed by your physician.

## 2017-05-27 NOTE — Progress Notes (Signed)
Consult Note: Gyn-Onc  Consult was requested by Dr. Lum Babeeniola for the evaluation of [name] 31 y.o. female  CC:  Chief Complaint  Patient presents with  . Carcinoma insitu of endocervix    Assessment/Plan:  [name]  is a 31 y.o.  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  I am recommending an excisional procedure with LEEP with top hat to ensure adequate endocervical sampling.  I discussed risks of the procedure including bleeding, infection, damage to adjacent structures.  I discussed postop recovery.  I discussed that she has a high risk for recurrent dysplasia in the future while she has persistent HPV infection and continues to smoke. We discussed smoking cessation.   HPI: [name] is a 31 year old P1 who is seen in consultation at the request of Dr Lum BabeEniola for CIN2/3.   The patient reports having HPV (high risk) infection since 2015 with mildly abnormal paps.  In April 2018 she had a LGSIL pap. Colposcopy with Dr Lum BabeEniola on 05/07/17 showed an adequate and normal TZ with no abnormal vessels or acetowhite lesions. ECC was performed which showed CIN 2/3.  Current Meds:  No outpatient encounter prescriptions on file as of 05/27/2017.   No facility-administered encounter medications on file as of 05/27/2017.     Allergy: No Known Allergies  Social Hx:   Social History   Social History  . Marital status: Single    Spouse name: N/A  . Number of children: N/A  . Years of education: N/A   Occupational History  . Not on file.   Social History Main Topics  . Smoking status: Current Every Day Smoker    Packs/day: 0.50    Types: Cigarettes  . Smokeless tobacco: Never Used  . Alcohol use No  . Drug use: No  . Sexual activity: Yes   Other Topics Concern  . Not on file   Social History Narrative  . No narrative on file    Past Surgical Hx:  Past Surgical History:  Procedure Laterality Date  . cyst removed      Past  Medical Hx: History reviewed. No pertinent past medical history.  Past Gynecological History:  Cesarean section x 1 Patient's last menstrual period was 05/02/2017.  Family Hx: History reviewed. No pertinent family history.  Review of Systems:  Constitutional  Feels well,    ENT Normal appearing ears and nares bilaterally Skin/Breast  No rash, sores, jaundice, itching, dryness Cardiovascular  No chest pain, shortness of breath, or edema  Pulmonary  No cough or wheeze.  Gastro Intestinal  No nausea, vomitting, or diarrhoea. No bright red blood per rectum, no abdominal pain, change in bowel movement, or constipation.  Genito Urinary  No frequency, urgency, dysuria, no abnormal bleeding Musculo Skeletal  No myalgia, arthralgia, joint swelling or pain  Neurologic  No weakness, numbness, change in gait,  Psychology  No depression, anxiety, insomnia.   Vitals:  Blood pressure 126/77, pulse 65, temperature 99.2 F (37.3 C), temperature source Oral, resp. rate 18, height 5' 5.83" (1.672 m), weight 180 lb 11.2 oz (82 kg), last menstrual period 05/02/2017, unknown if currently breastfeeding.  Physical Exam: WD in NAD Neck  Supple NROM, without any enlargements.  Lymph Node Survey No cervical supraclavicular or inguinal adenopathy Cardiovascular  Pulse normal rate, regularity and rhythm. S1 and S2 normal.  Lungs  Clear to auscultation bilateraly, without wheezes/crackles/rhonchi. Good air movement.  Skin  No rash/lesions/breakdown  Psychiatry  Alert and oriented to person, place, and time  Abdomen  Normoactive bowel sounds, abdomen soft, non-tender and nonobese without evidence of hernia.  Back No CVA tenderness Genito Urinary  Vulva/vagina: Normal external female genitalia.   No lesions. No discharge or bleeding.  Bladder/urethra:  No lesions or masses, well supported bladder  Vagina: normal  Cervix: Normal appearing, no lesions.  Uterus:  Small, mobile, no parametrial  involvement or nodularity.  Adnexa: no palpable masses. Rectal  deferred.  Extremities  No bilateral cyanosis, clubbing or edema.   Quinn Axe, MD  05/27/2017, 10:38 AM

## 2017-06-09 ENCOUNTER — Encounter (HOSPITAL_BASED_OUTPATIENT_CLINIC_OR_DEPARTMENT_OTHER): Payer: Self-pay | Admitting: *Deleted

## 2017-06-12 ENCOUNTER — Encounter (HOSPITAL_BASED_OUTPATIENT_CLINIC_OR_DEPARTMENT_OTHER): Payer: Self-pay | Admitting: *Deleted

## 2017-06-12 NOTE — Progress Notes (Signed)
[facility],urine pregnancy on arrival-Npo after Mn.

## 2017-06-16 ENCOUNTER — Encounter (HOSPITAL_BASED_OUTPATIENT_CLINIC_OR_DEPARTMENT_OTHER)
Admission: RE | Disposition: A | Discharge status: Home / Self Care | Payer: Self-pay | Source: Ambulatory Visit | Attending: Gynecologic Oncology

## 2017-06-16 ENCOUNTER — Telehealth: Payer: Self-pay | Admitting: *Deleted

## 2017-06-16 ENCOUNTER — Encounter: Payer: Self-pay | Admitting: Gynecologic Oncology

## 2017-06-16 ENCOUNTER — Ambulatory Visit (HOSPITAL_BASED_OUTPATIENT_CLINIC_OR_DEPARTMENT_OTHER): Payer: Medicaid Other | Admitting: Anesthesiology

## 2017-06-16 ENCOUNTER — Encounter (HOSPITAL_BASED_OUTPATIENT_CLINIC_OR_DEPARTMENT_OTHER): Payer: Self-pay | Admitting: *Deleted

## 2017-06-16 ENCOUNTER — Ambulatory Visit (HOSPITAL_BASED_OUTPATIENT_CLINIC_OR_DEPARTMENT_OTHER)
Admission: RE | Admit: 2017-06-16 | Discharge: 2017-06-16 | Disposition: A | Discharge status: Home / Self Care | Payer: Medicaid Other | Source: Ambulatory Visit | Attending: Gynecologic Oncology | Admitting: Gynecologic Oncology

## 2017-06-16 DIAGNOSIS — R87810 Cervical high risk human papillomavirus (HPV) DNA test positive: Secondary | ICD-10-CM | POA: Diagnosis not present

## 2017-06-16 DIAGNOSIS — D069 Carcinoma in situ of cervix, unspecified: Secondary | ICD-10-CM | POA: Insufficient documentation

## 2017-06-16 DIAGNOSIS — F17210 Nicotine dependence, cigarettes, uncomplicated: Secondary | ICD-10-CM | POA: Insufficient documentation

## 2017-06-16 DIAGNOSIS — D060 Carcinoma in situ of endocervix: Secondary | ICD-10-CM

## 2017-06-16 HISTORY — DX: Carcinoma in situ of cervix, unspecified: D06.9

## 2017-06-16 HISTORY — DX: Papillomavirus as the cause of diseases classified elsewhere: B97.7

## 2017-06-16 HISTORY — DX: Personal history of other infectious and parasitic diseases: Z86.19

## 2017-06-16 HISTORY — DX: Personal history of other diseases of the female genital tract: Z87.42

## 2017-06-16 HISTORY — PX: LEEP: SHX91

## 2017-06-16 LAB — POCT PREGNANCY, URINE: PREG TEST UR: NEGATIVE

## 2017-06-16 LAB — POCT HEMOGLOBIN-HEMACUE: Hemoglobin: 11.2 g/dL — ABNORMAL LOW (ref 12.0–15.0)

## 2017-06-16 SURGERY — LEEP (LOOP ELECTROSURGICAL EXCISION PROCEDURE)
Anesthesia: General

## 2017-06-16 MED ORDER — MIDAZOLAM HCL 5 MG/5ML IJ SOLN
INTRAMUSCULAR | Status: DC | PRN
  Administered 2017-06-16: 2 mg via INTRAVENOUS

## 2017-06-16 MED ORDER — MIDAZOLAM HCL 2 MG/2ML IJ SOLN
INTRAMUSCULAR | Status: AC
  Filled 2017-06-16: qty 2

## 2017-06-16 MED ORDER — OXYCODONE HCL 5 MG/5ML PO SOLN
5.0000 mg | Freq: Once | ORAL | Status: AC | PRN
  Filled 2017-06-16: qty 5

## 2017-06-16 MED ORDER — MEPERIDINE HCL 25 MG/ML IJ SOLN
6.2500 mg | INTRAMUSCULAR | Status: DC | PRN
  Filled 2017-06-16: qty 1

## 2017-06-16 MED ORDER — PROPOFOL 10 MG/ML IV BOLUS
INTRAVENOUS | Status: DC | PRN
  Administered 2017-06-16: 170 mg via INTRAVENOUS

## 2017-06-16 MED ORDER — ONDANSETRON HCL 4 MG/2ML IJ SOLN
INTRAMUSCULAR | Status: AC
  Filled 2017-06-16: qty 2

## 2017-06-16 MED ORDER — DEXAMETHASONE SODIUM PHOSPHATE 10 MG/ML IJ SOLN
INTRAMUSCULAR | Status: AC
  Filled 2017-06-16: qty 1

## 2017-06-16 MED ORDER — FENTANYL CITRATE (PF) 100 MCG/2ML IJ SOLN
INTRAMUSCULAR | Status: AC
  Filled 2017-06-16: qty 2

## 2017-06-16 MED ORDER — OXYCODONE HCL 5 MG PO TABS
5.0000 mg | ORAL_TABLET | Freq: Once | ORAL | Status: AC | PRN
  Administered 2017-06-16: 5 mg via ORAL
  Filled 2017-06-16: qty 1

## 2017-06-16 MED ORDER — HYDROMORPHONE HCL 1 MG/ML IJ SOLN
0.2500 mg | INTRAMUSCULAR | Status: DC | PRN
  Administered 2017-06-16: 0.5 mg via INTRAVENOUS
  Filled 2017-06-16: qty 0.5

## 2017-06-16 MED ORDER — PROMETHAZINE HCL 25 MG/ML IJ SOLN
6.2500 mg | INTRAMUSCULAR | Status: DC | PRN
  Filled 2017-06-16: qty 1

## 2017-06-16 MED ORDER — LIDOCAINE 2% (20 MG/ML) 5 ML SYRINGE
INTRAMUSCULAR | Status: DC | PRN
  Administered 2017-06-16: 60 mg via INTRAVENOUS

## 2017-06-16 MED ORDER — FERRIC SUBSULFATE SOLN
Status: DC | PRN
  Administered 2017-06-16: 1

## 2017-06-16 MED ORDER — HYDROMORPHONE HCL 1 MG/ML IJ SOLN
INTRAMUSCULAR | Status: AC
  Filled 2017-06-16: qty 0.5

## 2017-06-16 MED ORDER — ACETIC ACID 5 % SOLN
Status: DC | PRN
Start: 2017-06-16 — End: 2017-06-16
  Administered 2017-06-16: 1 via TOPICAL

## 2017-06-16 MED ORDER — KETOROLAC TROMETHAMINE 30 MG/ML IJ SOLN
INTRAMUSCULAR | Status: DC | PRN
  Administered 2017-06-16: 30 mg via INTRAVENOUS

## 2017-06-16 MED ORDER — IBUPROFEN 600 MG PO TABS: 600.0000 mg | ORAL_TABLET | Freq: Four times a day (QID) | ORAL | 0 refills | Status: DC | PRN

## 2017-06-16 MED ORDER — OXYCODONE HCL 5 MG PO TABS
ORAL_TABLET | ORAL | Status: AC
  Filled 2017-06-16: qty 1

## 2017-06-16 MED ORDER — KETOROLAC TROMETHAMINE 30 MG/ML IJ SOLN
INTRAMUSCULAR | Status: AC
  Filled 2017-06-16: qty 1

## 2017-06-16 MED ORDER — DEXAMETHASONE SODIUM PHOSPHATE 10 MG/ML IJ SOLN
INTRAMUSCULAR | Status: DC | PRN
  Administered 2017-06-16: 10 mg via INTRAVENOUS

## 2017-06-16 MED ORDER — LACTATED RINGERS IV SOLN
INTRAVENOUS | Status: DC
Start: 2017-06-16 — End: 2017-06-16
  Administered 2017-06-16: 10:00:00 via INTRAVENOUS
  Filled 2017-06-16: qty 1000

## 2017-06-16 MED ORDER — ONDANSETRON HCL 4 MG/2ML IJ SOLN
INTRAMUSCULAR | Status: DC | PRN
  Administered 2017-06-16: 4 mg via INTRAVENOUS

## 2017-06-16 MED ORDER — BUPIVACAINE-EPINEPHRINE (PF) 0.25% -1:200000 IJ SOLN
INTRAMUSCULAR | Status: DC | PRN
  Administered 2017-06-16: 10 mL via PERINEURAL

## 2017-06-16 MED ORDER — FENTANYL CITRATE (PF) 100 MCG/2ML IJ SOLN
INTRAMUSCULAR | Status: DC | PRN
  Administered 2017-06-16: 100 ug via INTRAVENOUS

## 2017-06-16 SURGICAL SUPPLY — 30 items
APPLICATOR COTTON TIP 6IN STRL (MISCELLANEOUS) ×1 IMPLANT
BLADE SURG 11 STRL SS (BLADE) ×2 IMPLANT
CANISTER SUCT 3000ML PPV (MISCELLANEOUS) IMPLANT
CATH ROBINSON RED A/P 16FR (CATHETERS) ×2 IMPLANT
ELECT BALL LEEP 3MM BLK (ELECTRODE) IMPLANT
ELECT BALL LEEP 5MM RED (ELECTRODE) IMPLANT
ELECT BLADE 6.5 .24CM SHAFT (ELECTRODE) ×3 IMPLANT
ELECT LOOP 20X12 DISP (CUTTING LOOP)
ELECT LOOP LEEP RND 15X12 GRN (CUTTING LOOP)
ELECT LOOP LEEP RND 20X12 WHT (CUTTING LOOP) ×2
ELECT LOOP LLETZ 10X10 DISP (CUTTING LOOP)
ELECTRODE LOOP 20X12 DISP (CUTTING LOOP) IMPLANT
ELECTRODE LOOP LP RND 15X12GRN (CUTTING LOOP) IMPLANT
ELECTRODE LOOP LP RND 20X12WHT (CUTTING LOOP) IMPLANT
ELECTRODE LOOP LTZ 10X10 DISP (CUTTING LOOP) IMPLANT
GLOVE BIO SURGEON STRL SZ 6 (GLOVE) ×4 IMPLANT
GOWN STRL REUS W/ TWL LRG LVL3 (GOWN DISPOSABLE) ×1 IMPLANT
GOWN STRL REUS W/TWL LRG LVL3 (GOWN DISPOSABLE) ×2
KIT RM TURNOVER CYSTO AR (KITS) ×2 IMPLANT
NS IRRIG 500ML POUR BTL (IV SOLUTION) ×2 IMPLANT
PACK VAGINAL WOMENS (CUSTOM PROCEDURE TRAY) ×2 IMPLANT
SCOPETTES 8  STERILE (MISCELLANEOUS) ×1
SCOPETTES 8 STERILE (MISCELLANEOUS) ×1 IMPLANT
SPONGE SURGIFOAM ABS GEL 12-7 (HEMOSTASIS) IMPLANT
SUT VIC AB 0 CT1 36 (SUTURE) ×2 IMPLANT
SUT VIC AB 2-0 CT2 27 (SUTURE) IMPLANT
SUT VIC AB 2-0 UR6 27 (SUTURE) IMPLANT
SUT VICRYL 0 UR6 27IN ABS (SUTURE) ×4 IMPLANT
SYR BULB IRRIGATION 50ML (SYRINGE) IMPLANT
VACUUM HOSE/TUBING 7/8INX6FT (MISCELLANEOUS) IMPLANT

## 2017-06-16 NOTE — Anesthesia Postprocedure Evaluation (Signed)
Anesthesia Post Note  Patient: Seabron SpatesWanaqui H Pumphrey  Procedure(s) Performed: Procedure(s) (LRB): LOOP ELECTROSURGICAL EXCISION PROCEDURE (LEEP) (N/A)     Patient location during evaluation: PACU Anesthesia Type: General Level of consciousness: awake and alert Pain management: pain level controlled Vital Signs Assessment: post-procedure vital signs reviewed and stable Respiratory status: spontaneous breathing, nonlabored ventilation and respiratory function stable Cardiovascular status: blood pressure returned to baseline and stable Postop Assessment: no signs of nausea or vomiting Anesthetic complications: no    Last Vitals:  Vitals:   06/16/17 1130 06/16/17 1145  BP: 103/68 (!) 98/59  Pulse: 83 69  Resp: 17 19  Temp:      Last Pain:  Vitals:   06/16/17 1145  TempSrc:   PainSc: 7                  Lowella CurbWarren Ray Neeley Sedivy

## 2017-06-16 NOTE — Op Note (Signed)
PATIENT: [name] ENCOUNTER DATE: 06/16/17   Preop Diagnosis: CIN 3  Postoperative Diagnosis: same  Surgery: LEEP of cervix   Surgeons:  Quinn Axeossi, Wiliam Cauthorn Caroline, MD Assistant: none  Anesthesia: General with LMA  Estimated blood loss: 25ml  IVF:  200ml   Urine output: 20 ml   Complications: None   Pathology: cervical LEEP with marking stitch at 12 o'clock, post cone ECC, endocervical top hat with marking stitch at distal 12 o'clock anterior margin (specimen open at left upper corner), post LEEP ECC  Operative findings: grossly normal appearing cervix with no ectocervical acetowhite changes  Procedure: The patient was identified in the preoperative holding area. Informed consent was signed on the chart. Patient was seen history was reviewed and exam was performed.   The patient was then taken to the operating room and placed in the supine position with SCD hose on. General anesthesia was then induced without difficulty. She was then placed in the dorsolithotomy position. The perineum was prepped with Betadine. The vagina was prepped with Betadine. The patient was then draped after the prep was dried. An in and out catheterization to empty the bladder was performed under sterile conditions.  Timeout was performed the patient, procedure, antibiotic, allergy, and length of procedure.   The speculum was placed in the vagina. A tenaculum grasped the cervix. 10cc of 1% lidocaine with epinephrine was infiltrated into 3 and 9 o'clock of the cervicovaginal junction, circumferentially around the face of the cervix, and deep in the endocervical canal. The 2x1.8cm loop was selected and a LEEP was performed at 65 blend current with the bovie. It was removed, oriented with a marking stitch at the 12 o'clock ectocervix and sent to pathology. A narrow top hat was taken with the narrow loop in a similar fashion and marked at a similar location. It appeared circumferentially contiguous but separated  at the left upper margin.   A post-LEEP ECC was collected from the endocervical canal using a kavorkian currette.  The bovie with the 5mm ball attachment was used at 45 coag to create hemostasis at the surgical bed. Monsels solution was applied to the surgical bed to consolidate this hemostasis.  An 2cm area of bleeding was seen at the right vaginal sidewall (midway). It was sutured with 0-vicryl running locked suture to establish hemostasis.  The vagina was irrigated.  All instrument, suture, laparotomy, Ray-Tec, and needle counts were correct x2. The patient tolerated the procedure well and was taken recovery room in stable condition. This is Adolphus Birchwoodmma Nesanel Aguila dictating an operative note on [name].

## 2017-06-16 NOTE — Telephone Encounter (Signed)
Scheduled post op appt and patient will receive appt with discharge summary  

## 2017-06-16 NOTE — Transfer of Care (Signed)
Immediate Anesthesia Transfer of Care Note  Patient: [name]  Procedure(s) Performed: Procedure(s): LOOP ELECTROSURGICAL EXCISION PROCEDURE (LEEP) (N/A)  Patient Location: PACU  Anesthesia Type:General  Level of Consciousness: awake, alert , oriented and patient cooperative  Airway & Oxygen Therapy: Patient Spontanous Breathing and Patient connected to nasal cannula oxygen  Post-op Assessment: Report given to RN and Post -op Vital signs reviewed and stable  Post vital signs: Reviewed and stable  Last Vitals:  Vitals:   06/16/17 0916  BP: 109/70  Pulse: 78  Resp: 16  Temp: 36.9 C    Last Pain:  Vitals:   06/16/17 0916  TempSrc: Oral      Patients Stated Pain Goal: 8 (06/16/17 1002)  Complications: No apparent anesthesia complications

## 2017-06-16 NOTE — Anesthesia Preprocedure Evaluation (Addendum)
Anesthesia Evaluation  Patient identified by MRN, date of birth, ID band Patient awake    Reviewed: Allergy & Precautions, NPO status , Patient's Chart, lab work & pertinent test results  Airway Mallampati: II  TM Distance: >3 FB Neck ROM: Full    Dental no notable dental hx. (+) Teeth Intact, Dental Advisory Given   Pulmonary neg pulmonary ROS, Current Smoker, former smoker,    Pulmonary exam normal breath sounds clear to auscultation       Cardiovascular negative cardio ROS Normal cardiovascular exam Rhythm:Regular Rate:Normal     Neuro/Psych  Headaches, negative neurological ROS  negative psych ROS   GI/Hepatic negative GI ROS, Neg liver ROS,   Endo/Other  negative endocrine ROS  Renal/GU negative Renal ROS  negative genitourinary   Musculoskeletal negative musculoskeletal ROS (+)   Abdominal   Peds negative pediatric ROS (+)  Hematology negative hematology ROS (+)   Anesthesia Other Findings False eyelashes in place  Reproductive/Obstetrics negative OB ROS                            Anesthesia Physical Anesthesia Plan  ASA: II  Anesthesia Plan: General   Post-op Pain Management:    Induction: Intravenous  PONV Risk Score and Plan: 3 and Ondansetron, Propofol, Midazolam and Treatment may vary due to age or medical condition  Airway Management Planned: LMA  Additional Equipment:   Intra-op Plan:   Post-operative Plan: Extubation in OR  Informed Consent: I have reviewed the patients History and Physical, chart, labs and discussed the procedure including the risks, benefits and alternatives for the proposed anesthesia with the patient or authorized representative who has indicated his/her understanding and acceptance.   Dental advisory given  Plan Discussed with: CRNA  Anesthesia Plan Comments:         Anesthesia Quick Evaluation

## 2017-06-16 NOTE — H&P (View-Only) (Signed)
Consult Note: Gyn-Onc  Consult was requested by Dr. Lum Babeeniola for the evaluation of [name] 31 y.o. female  CC:  Chief Complaint  Patient presents with  . Carcinoma insitu of endocervix    Assessment/Plan:  Ms. [name]  is a 31 y.o.  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  I am recommending an excisional procedure with LEEP with top hat to ensure adequate endocervical sampling.  I discussed risks of the procedure including bleeding, infection, damage to adjacent structures.  I discussed postop recovery.  I discussed that she has a high risk for recurrent dysplasia in the future while she has persistent HPV infection and continues to smoke. We discussed smoking cessation.   HPI: [name] is a 31 year old P1 who is seen in consultation at the request of Dr Lum BabeEniola for CIN2/3.   The patient reports having HPV (high risk) infection since 2015 with mildly abnormal paps.  In April 2018 she had a LGSIL pap. Colposcopy with Dr Lum BabeEniola on 05/07/17 showed an adequate and normal TZ with no abnormal vessels or acetowhite lesions. ECC was performed which showed CIN 2/3.  Current Meds:  No outpatient encounter prescriptions on file as of 05/27/2017.   No facility-administered encounter medications on file as of 05/27/2017.     Allergy: No Known Allergies  Social Hx:   Social History   Social History  . Marital status: Single    Spouse name: N/A  . Number of children: N/A  . Years of education: N/A   Occupational History  . Not on file.   Social History Main Topics  . Smoking status: Current Every Day Smoker    Packs/day: 0.50    Types: Cigarettes  . Smokeless tobacco: Never Used  . Alcohol use No  . Drug use: No  . Sexual activity: Yes   Other Topics Concern  . Not on file   Social History Narrative  . No narrative on file    Past Surgical Hx:  Past Surgical History:  Procedure Laterality Date  . cyst removed      Past  Medical Hx: History reviewed. No pertinent past medical history.  Past Gynecological History:  Cesarean section x 1 Patient's last menstrual period was 05/02/2017.  Family Hx: History reviewed. No pertinent family history.  Review of Systems:  Constitutional  Feels well,    ENT Normal appearing ears and nares bilaterally Skin/Breast  No rash, sores, jaundice, itching, dryness Cardiovascular  No chest pain, shortness of breath, or edema  Pulmonary  No cough or wheeze.  Gastro Intestinal  No nausea, vomitting, or diarrhoea. No bright red blood per rectum, no abdominal pain, change in bowel movement, or constipation.  Genito Urinary  No frequency, urgency, dysuria, no abnormal bleeding Musculo Skeletal  No myalgia, arthralgia, joint swelling or pain  Neurologic  No weakness, numbness, change in gait,  Psychology  No depression, anxiety, insomnia.   Vitals:  Blood pressure 126/77, pulse 65, temperature 99.2 F (37.3 C), temperature source Oral, resp. rate 18, height 5' 5.83" (1.672 m), weight 180 lb 11.2 oz (82 kg), last menstrual period 05/02/2017, unknown if currently breastfeeding.  Physical Exam: WD in NAD Neck  Supple NROM, without any enlargements.  Lymph Node Survey No cervical supraclavicular or inguinal adenopathy Cardiovascular  Pulse normal rate, regularity and rhythm. S1 and S2 normal.  Lungs  Clear to auscultation bilateraly, without wheezes/crackles/rhonchi. Good air movement.  Skin  No rash/lesions/breakdown  Psychiatry  Alert and oriented to person, place, and time  Abdomen  Normoactive bowel sounds, abdomen soft, non-tender and nonobese without evidence of hernia.  Back No CVA tenderness Genito Urinary  Vulva/vagina: Normal external female genitalia.   No lesions. No discharge or bleeding.  Bladder/urethra:  No lesions or masses, well supported bladder  Vagina: normal  Cervix: Normal appearing, no lesions.  Uterus:  Small, mobile, no parametrial  involvement or nodularity.  Adnexa: no palpable masses. Rectal  deferred.  Extremities  No bilateral cyanosis, clubbing or edema.   Quinn Axe, MD  05/27/2017, 10:38 AM

## 2017-06-16 NOTE — Interval H&P Note (Signed)
History and Physical Interval Note:  06/16/2017 10:15 AM  [name]  has presented today for surgery, with the diagnosis of CERVICAL DYSPLAGIA (CI3)  The various methods of treatment have been discussed with the patient and family. After consideration of risks, benefits and other options for treatment, the patient has consented to  Procedure(s): LOOP ELECTROSURGICAL EXCISION PROCEDURE (LEEP) (N/A) as a surgical intervention .  The patient's history has been reviewed, patient examined, no change in status, stable for surgery.  I have reviewed the patient's chart and labs.  Questions were answered to the patient's satisfaction.     Quinn Axeossi, Kamaryn Grimley Caroline

## 2017-06-16 NOTE — Discharge Instructions (Signed)
LEEP, Care After ACTIVITY  Rest as much as possible the first two days after discharge.  You do not have weight restrictions on lifting  Avoid strenuous working out such as running or lifting weights for 48-72 hours  You can climb stairs and drive a car NUTRITION  You may resume your normal diet.  Drink 6 to 8 glasses of fluids a day.  Eat a healthy, balanced diet including portions of food from the meat (protein), milk, fruit, vegetable, and bread groups.  Your caregiver may recommend you take a multivitamin with iron.  ELIMINATION   If constipation occurs, drink more liquids, and add more fruits, vegetables, and bran to your diet. You may take a mild laxative, such as Milk of Magnesia, Metamucil, or a stool softener such as Colace, with permission from your caregiver.  Medications:  - Take ibuprofen and tylenol first line for pain control. Take these regularly (every 6 hours) to decrease the build up of pain.  HYGIENE  You may shower and wash your hair.  Avoid tub baths for 4 weeks  Do not add any bath oils or chemicals to your bath water, after you have permission to take baths.  Avoid placing anything in the vagina for 4 weeks.  It is normal to pass a brown-flecked discharge from the vagina for several weeks after your procedure.  HOME CARE INSTRUCTIONS   Take your temperature twice a day and record it, especially if you feel feverish or have chills.  Follow your caregiver's instructions about medicines, activity, and follow-up appointments after surgery.  Do not drink alcohol while taking pain medicine.  You may take over-the-counter medicine for pain, recommended by your caregiver.  If your pain is not relieved with medicine, call your caregiver.  Do not take aspirin because it can cause bleeding.  Do not douche or use tampons (use a nonperfumed sanitary pad).  Do not have sexual intercourse for 6 weeks postoperatively. Hugging, kissing, and playful  sexual activity is fine with your caregiver's permission.  Take showers instead of baths, until your caregiver gives you permission to take baths.  You may take a mild medicine for constipation, recommended by your caregiver. Bran foods and drinking a lot of fluids will help with constipation.  Make sure your family understands everything about your operation and recovery.  SEEK MEDICAL CARE IF:    You notice a foul smell coming from the vagina.  You have painful or bloody urination.  You develop nausea and vomiting.  You develop diarrhea.  You develop a rash.  You have a reaction or allergy from the medicine.  You feel dizzy or light-headed.  You need stronger pain medicine.   SEEK IMMEDIATE MEDICAL CARE IF:   You develop a temperature of 102 F (38.9 C) or higher.  You pass out.  You develop leg or chest pain.  You develop abdominal pain.  You develop shortness of breath.  You bleed heavier than a period (soaking through 2 or more pads per hour for 2 hours in a row).  You see pus in the wound area.  MAKE SURE YOU:   Understand these instructions.  Will watch your condition.  Will get help right away if you are not doing well or get worse. Document Released: 07/29/2004 Document Revised: 05/01/2014 Document Reviewed: 11/16/2009 [facility] Patient Information 2015 [place], [place]. This information is not intended to replace advice given to you by your health care provider. Make sure you discuss any questions you have with your  health care provider.  Post Anesthesia Home Care Instructions  Activity: Get plenty of rest for the remainder of the day. A responsible individual must stay with you for 24 hours following the procedure.  For the next 24 hours, DO NOT: -Drive a car -Advertising copywriterperate machinery -Drink alcoholic beverages -Take any medication unless instructed by your physician -Make any legal decisions or sign important papers.  Meals: Start with liquid  foods such as gelatin or soup. Progress to regular foods as tolerated. Avoid greasy, spicy, heavy foods. If nausea and/or vomiting occur, drink only clear liquids until the nausea and/or vomiting subsides. Call your physician if vomiting continues.  Special Instructions/Symptoms: Your throat may feel dry or sore from the anesthesia or the breathing tube placed in your throat during surgery. If this causes discomfort, gargle with warm salt water. The discomfort should disappear within 24 hours.  If you had a scopolamine patch placed behind your ear for the management of post- operative nausea and/or vomiting:  1. The medication in the patch is effective for 72 hours, after which it should be removed.  Wrap patch in a tissue and discard in the trash. Wash hands thoroughly with soap and water. 2. You may remove the patch earlier than 72 hours if you experience unpleasant side effects which may include dry mouth, dizziness or visual disturbances. 3. Avoid touching the patch. Wash your hands with soap and water after contact with the patch.

## 2017-06-16 NOTE — Anesthesia Procedure Notes (Signed)
Procedure Name: LMA Insertion Date/Time: 06/16/2017 10:40 AM Performed by: Tyrone NineSAUVE, Kimi Kroft F Pre-anesthesia Checklist: Patient identified, Timeout performed, Emergency Drugs available, Suction available and Patient being monitored Patient Re-evaluated:Patient Re-evaluated prior to inductionOxygen Delivery Method: Circle system utilized Preoxygenation: Pre-oxygenation with 100% oxygen Intubation Type: IV induction Ventilation: Mask ventilation without difficulty LMA: LMA inserted LMA Size: 4.0 Number of attempts: 1 Placement Confirmation: breath sounds checked- equal and bilateral and positive ETCO2 Tube secured with: Tape Dental Injury: Teeth and Oropharynx as per pre-operative assessment

## 2017-06-17 ENCOUNTER — Encounter (HOSPITAL_BASED_OUTPATIENT_CLINIC_OR_DEPARTMENT_OTHER): Payer: Self-pay | Admitting: Gynecologic Oncology

## 2017-06-18 ENCOUNTER — Telehealth: Payer: Self-pay | Admitting: Gynecologic Oncology

## 2017-06-18 NOTE — Telephone Encounter (Signed)
Patient stating she is doing well after her procedure.  Informed of final path results.  Follow up appt already arranged.  Advised to call for any needs.

## 2017-07-13 ENCOUNTER — Encounter: Payer: Self-pay | Admitting: Gynecologic Oncology

## 2017-07-13 ENCOUNTER — Ambulatory Visit: Payer: Medicaid Other | Attending: Gynecologic Oncology | Admitting: Gynecologic Oncology

## 2017-07-13 VITALS — BP 139/84 | HR 66 | Temp 98.4°F | Resp 20 | Wt 189.3 lb

## 2017-07-13 DIAGNOSIS — Z716 Tobacco abuse counseling: Secondary | ICD-10-CM | POA: Diagnosis not present

## 2017-07-13 DIAGNOSIS — D060 Carcinoma in situ of endocervix: Secondary | ICD-10-CM | POA: Diagnosis not present

## 2017-07-13 DIAGNOSIS — Z7189 Other specified counseling: Secondary | ICD-10-CM | POA: Diagnosis not present

## 2017-07-13 DIAGNOSIS — D061 Carcinoma in situ of exocervix: Secondary | ICD-10-CM | POA: Diagnosis present

## 2017-07-13 DIAGNOSIS — Z87891 Personal history of nicotine dependence: Secondary | ICD-10-CM | POA: Diagnosis not present

## 2017-07-13 DIAGNOSIS — Z9889 Other specified postprocedural states: Secondary | ICD-10-CM | POA: Insufficient documentation

## 2017-07-13 DIAGNOSIS — F17218 Nicotine dependence, cigarettes, with other nicotine-induced disorders: Secondary | ICD-10-CM

## 2017-07-13 NOTE — Patient Instructions (Signed)
Follow up with Dr. Lum BabeEniola.  No follow up necessary with Dr. Andrey Farmerossi.  Please call for any needs or concerns.

## 2017-07-13 NOTE — Progress Notes (Signed)
Follow-up Note: Gyn-Onc  Consult was requested by Dr. Lum Babeeniola for the evaluation of [name] 31 y.o. female  CC:  Chief Complaint  Patient presents with  . Carcinoma in situ of endocervix    Assessment/Plan:  [name]  is a 31 y.o.  year old with a history of CIN2/3 on an ECC in the setting of HPV infection s/p LEEP with top hat on 06/16/17. Margins were negative for dysplasia.  I am recommending a follow-up pap in 6-12 months with Dr Lum BabeEniola in accordance with ASCCP guidelines.  I discussed that she has a high risk for recurrent dysplasia in the future while she has persistent HPV infection and continues to smoke. We discussed smoking cessation.   HPI: [name] is a 31 year old P1 who is seen in consultation at the request of Dr Lum BabeEniola for CIN2/3.   The patient reports having HPV (high risk) infection since 2015 with mildly abnormal paps.  In April 2018 she had a LGSIL pap. Colposcopy with Dr Lum BabeEniola on 05/07/17 showed an adequate and normal TZ with no abnormal vessels or acetowhite lesions. ECC was performed which showed CIN 2/3.  Interval Hx: On 06/16/17 she underwent a LEEP with top hat for CIS. Final pathology revealed CIN III with negative margins, negative endocervical top hat and post LEEP ECC.  Current Meds:  Outpatient Encounter Prescriptions as of 07/13/2017  Medication Sig  . [DISCONTINUED] ibuprofen (ADVIL,MOTRIN) 600 MG tablet Take 1 tablet (600 mg total) by mouth every 6 (six) hours as needed.   No facility-administered encounter medications on file as of 07/13/2017.     Allergy: No Known Allergies  Social Hx:   Social History   Social History  . Marital status: Single    Spouse name: N/A  . Number of children: N/A  . Years of education: N/A   Occupational History  . Not on file.   Social History Main Topics  . Smoking status: Former Smoker    Packs/day: 0.50    Types: Cigarettes    Quit date: 09/12/2016  . Smokeless tobacco:  Never Used  . Alcohol use No  . Drug use: No  . Sexual activity: Yes   Other Topics Concern  . Not on file   Social History Narrative  . No narrative on file    Past Surgical Hx:  Past Surgical History:  Procedure Laterality Date  . CESAREAN SECTION  01/30/2005  . LEEP N/A 06/16/2017   Procedure: LOOP ELECTROSURGICAL EXCISION PROCEDURE (LEEP);  Surgeon: Adolphus Birchwoodossi, Dezarae Mcclaran, MD;  [facility];  Service: Gynecology;  Laterality: N/A;  . WRIST GANGLION EXCISION Left 02/09/2002   by arthroscopically    Past Medical Hx:  Past Medical History:  Diagnosis Date  . CIN III (cervical intraepithelial neoplasia III)   . History of chlamydia 05/2007  . History of PID 05/2007  . History of trichomonal vaginitis 05/2014  . HPV in female     Past Gynecological History:  Cesarean section x 1 No LMP recorded.  Family Hx: History reviewed. No pertinent family history.  Review of Systems:  Constitutional  Feels well,    ENT Normal appearing ears and nares bilaterally Skin/Breast  No rash, sores, jaundice, itching, dryness Cardiovascular  No chest pain, shortness of breath, or edema  Pulmonary  No cough or wheeze.  Gastro Intestinal  No nausea, vomitting, or diarrhoea. No bright red blood per rectum, no abdominal pain, change in bowel movement, or constipation.  Genito  Urinary  No frequency, urgency, dysuria, no abnormal bleeding Musculo Skeletal  No myalgia, arthralgia, joint swelling or pain  Neurologic  No weakness, numbness, change in gait,  Psychology  No depression, anxiety, insomnia.   Vitals:  Blood pressure 139/84, pulse 66, temperature 98.4 F (36.9 C), temperature source Oral, resp. rate 20, weight 189 lb 4.8 oz (85.9 kg), SpO2 100 %, unknown if currently breastfeeding.  Physical Exam: WD in NAD Neck  Supple NROM, without any enlargements.  Lymph Node Survey No cervical supraclavicular or inguinal adenopathy Cardiovascular  Pulse normal rate,  regularity and rhythm. S1 and S2 normal.  Lungs  Clear to auscultation bilateraly, without wheezes/crackles/rhonchi. Good air movement.  Skin  No rash/lesions/breakdown  Psychiatry  Alert and oriented to person, place, and time  Abdomen  Normoactive bowel sounds, abdomen soft, non-tender and nonobese without evidence of hernia.  Back No CVA tenderness Genito Urinary:  LEEP bed well healed. Suture material on sidewall almost evacuated.  Rectal  deferred.  Extremities  No bilateral cyanosis, clubbing or edema.   Quinn Axe, MD  07/13/2017, 1:51 PM

## 2017-07-14 ENCOUNTER — Encounter: Payer: Self-pay | Admitting: Family Medicine

## 2017-07-14 NOTE — Patient Instructions (Signed)
Hello [name],  Just wanted to forward message to you for your patient from Dr. Andrey Farmerossi. Please follow-up with her on the recommendation to repeat PAP in 6-12 months. Thanks.     I recommend follow-up pap in 6-12 months.  Thanks  Deanna BirchwoodEmma Rossi.

## 2017-10-25 ENCOUNTER — Other Ambulatory Visit: Payer: Self-pay | Admitting: Internal Medicine

## 2017-10-25 DIAGNOSIS — N760 Acute vaginitis: Principal | ICD-10-CM

## 2017-10-25 DIAGNOSIS — B9689 Other specified bacterial agents as the cause of diseases classified elsewhere: Secondary | ICD-10-CM

## 2019-06-30 ENCOUNTER — Other Ambulatory Visit: Payer: Self-pay

## 2019-06-30 ENCOUNTER — Encounter (HOSPITAL_COMMUNITY): Payer: Self-pay | Admitting: Emergency Medicine

## 2019-06-30 ENCOUNTER — Emergency Department (HOSPITAL_COMMUNITY): Payer: Self-pay

## 2019-06-30 ENCOUNTER — Emergency Department (HOSPITAL_COMMUNITY)
Admission: EM | Admit: 2019-06-30 | Discharge: 2019-06-30 | Disposition: A | Discharge status: Home / Self Care | Payer: Self-pay | Attending: Emergency Medicine | Admitting: Emergency Medicine

## 2019-06-30 DIAGNOSIS — Z87891 Personal history of nicotine dependence: Secondary | ICD-10-CM | POA: Insufficient documentation

## 2019-06-30 DIAGNOSIS — D6489 Other specified anemias: Secondary | ICD-10-CM | POA: Insufficient documentation

## 2019-06-30 DIAGNOSIS — N939 Abnormal uterine and vaginal bleeding, unspecified: Secondary | ICD-10-CM | POA: Insufficient documentation

## 2019-06-30 DIAGNOSIS — R1032 Left lower quadrant pain: Secondary | ICD-10-CM | POA: Insufficient documentation

## 2019-06-30 DIAGNOSIS — Z86001 Personal history of in-situ neoplasm of cervix uteri: Secondary | ICD-10-CM | POA: Insufficient documentation

## 2019-06-30 LAB — COMPREHENSIVE METABOLIC PANEL
ALT: 24 U/L (ref 0–44)
AST: 25 U/L (ref 15–41)
Albumin: 4.1 g/dL (ref 3.5–5.0)
Alkaline Phosphatase: 51 U/L (ref 38–126)
Anion gap: 11 (ref 5–15)
BUN: 19 mg/dL (ref 6–20)
CO2: 22 mmol/L (ref 22–32)
Calcium: 9.2 mg/dL (ref 8.9–10.3)
Chloride: 106 mmol/L (ref 98–111)
Creatinine, Ser: 0.88 mg/dL (ref 0.44–1.00)
GFR calc Af Amer: >60 mL/min (ref >60)
GFR calc non Af Amer: >60 mL/min (ref >60)
Glucose, Bld: 109 mg/dL — ABNORMAL HIGH (ref 70–99)
Potassium: 3.6 mmol/L (ref 3.5–5.1)
Sodium: 139 mmol/L (ref 135–145)
Total Bilirubin: 0.5 mg/dL (ref 0.3–1.2)
Total Protein: 7.2 g/dL (ref 6.5–8.1)

## 2019-06-30 LAB — WET PREP, GENITAL
Clue Cells Wet Prep HPF POC: NONE SEEN
Sperm: NONE SEEN
Trich, Wet Prep: NONE SEEN
Yeast Wet Prep HPF POC: NONE SEEN

## 2019-06-30 LAB — I-STAT BETA HCG BLOOD, ED (MC, WL, AP ONLY): I-stat hCG, quantitative: <5 m[IU]/mL (ref <5)

## 2019-06-30 LAB — CBC WITH DIFFERENTIAL/PLATELET
Abs Immature Granulocytes: 0.02 10*3/uL (ref 0.00–0.07)
Basophils Absolute: 0.1 10*3/uL (ref 0.0–0.1)
Basophils Relative: 1 %
Eosinophils Absolute: 0.1 10*3/uL (ref 0.0–0.5)
Eosinophils Relative: 1 %
HCT: 35.3 % — ABNORMAL LOW (ref 36.0–46.0)
Hemoglobin: 11.4 g/dL — ABNORMAL LOW (ref 12.0–15.0)
Immature Granulocytes: 0 %
Lymphocytes Relative: 21 %
Lymphs Abs: 1 10*3/uL (ref 0.7–4.0)
MCH: 26.7 pg (ref 26.0–34.0)
MCHC: 32.3 g/dL (ref 30.0–36.0)
MCV: 82.7 fL (ref 80.0–100.0)
Monocytes Absolute: 0.3 10*3/uL (ref 0.1–1.0)
Monocytes Relative: 7 %
Neutro Abs: 3.3 10*3/uL (ref 1.7–7.7)
Neutrophils Relative %: 70 %
Platelets: 252 10*3/uL (ref 150–400)
RBC: 4.27 MIL/uL (ref 3.87–5.11)
RDW: 14.4 % (ref 11.5–15.5)
WBC: 4.9 10*3/uL (ref 4.0–10.5)
nRBC: 0 % (ref 0.0–0.2)

## 2019-06-30 LAB — URINALYSIS, ROUTINE W REFLEX MICROSCOPIC
Bacteria, UA: NONE SEEN
Bilirubin Urine: NEGATIVE
Glucose, UA: NEGATIVE mg/dL
Ketones, ur: NEGATIVE mg/dL
Leukocytes,Ua: NEGATIVE
Nitrite: NEGATIVE
Protein, ur: 100 mg/dL — AB
RBC / HPF: >50 RBC/hpf — ABNORMAL HIGH (ref 0–5)
Specific Gravity, Urine: 1.029 (ref 1.005–1.030)
pH: 6 (ref 5.0–8.0)

## 2019-06-30 LAB — LIPASE, BLOOD: Lipase: 26 U/L (ref 11–51)

## 2019-06-30 MED ORDER — ONDANSETRON HCL 4 MG PO TABS: 4.0000 mg | ORAL_TABLET | Freq: Four times a day (QID) | ORAL | 0 refills | Status: DC

## 2019-06-30 MED ORDER — MELOXICAM 7.5 MG PO TABS: 7.5000 mg | ORAL_TABLET | Freq: Every day | ORAL | 0 refills | Status: DC

## 2019-06-30 MED ORDER — ACETAMINOPHEN 500 MG PO TABS: 500.0000 mg | ORAL_TABLET | Freq: Four times a day (QID) | ORAL | 0 refills | Status: DC | PRN

## 2019-06-30 MED ORDER — KETOROLAC TROMETHAMINE 30 MG/ML IJ SOLN
30.0000 mg | Freq: Once | INTRAMUSCULAR | Status: AC
  Administered 2019-06-30: 30 mg via INTRAVENOUS
  Filled 2019-06-30: qty 1

## 2019-06-30 NOTE — ED Provider Notes (Signed)
[facility] Provider Note   CSN: [id number] Arrival date & time: 06/30/19  1154    History   Chief Complaint Chief Complaint  Patient presents with   Abdominal Pain    HPI Deanna SpatesWanaqui H Ford is a 33 y.o. female with history of PID, CIN III who presents with a 1 day history of lower abdominal pain.  Patient describes the pain as a dull ache.  It is constant and radiates to the left lower back.  Nothing makes it better or worse.  Patient denies any associated symptoms including nausea, vomiting, diarrhea, fevers, chest pain, shortness of breath, abnormal vaginal discharge, urinary symptoms.  Patient does note that she is having vaginal bleeding now, however she already had a menstrual cycle earlier this month.  The one earlier this month was not normal and much lighter, however was around the correct time.  Patient is not currently on any birth control.  She does not use condoms.  She is sexually active.  She denies any concern for STD exposure.  She has taken Tylenol at home without relief.     HPI  Past Medical History:  Diagnosis Date   CIN III (cervical intraepithelial neoplasia III)    History of chlamydia 05/2007   History of PID 05/2007   History of trichomonal vaginitis 05/2014   HPV in female     Patient Active Problem List   Diagnosis Date Noted   Carcinoma in situ of endocervix 05/27/2017   Encounter for annual physical exam 10/30/2016   Infection of urinary tract 10/30/2016   Pap smear of cervix to confirm normal smear following abnormal smear 06/02/2014   Contraceptive management 06/02/2014   ABNORMAL PAP SMEAR, LGSIL 04/25/2010   Quit smoking 09/15/2009   Depression 11/01/2008   MIGRAINE, UNSPEC., W/O INTRACTABLE MIGRAINE 02/25/2007    Past Surgical History:  Procedure Laterality Date   CESAREAN SECTION  01/30/2005   LEEP N/A 06/16/2017   Procedure: LOOP ELECTROSURGICAL EXCISION PROCEDURE (LEEP);  Surgeon:  Adolphus Birchwoodossi, Emma, MD;  [facility] [place];  Service: Gynecology;  Laterality: N/A;   WRIST GANGLION EXCISION Left 02/09/2002   by arthroscopically     OB History    Gravida  1   Para      Term      Preterm      AB      Living        SAB      TAB      Ectopic      Multiple      Live Births               Home Medications    Prior to Admission medications   Medication Sig Start Date End Date Taking? Authorizing Provider  acetaminophen (TYLENOL) 500 MG tablet Take 1 tablet (500 mg total) by mouth every 6 (six) hours as needed. 06/30/19   Laurelin Elson, Waylan BogaAlexandra M, PA-C  meloxicam (MOBIC) 7.5 MG tablet Take 1 tablet (7.5 mg total) by mouth daily. 06/30/19   Glyn Gerads, Waylan BogaAlexandra M, PA-C  ondansetron (ZOFRAN) 4 MG tablet Take 1 tablet (4 mg total) by mouth every 6 (six) hours. 06/30/19   Hareem Surowiec, Waylan BogaAlexandra M, PA-C  medroxyPROGESTERone (PROVERA) 10 MG tablet Take 1 tablet (10 mg total) by mouth daily. Patient not taking: Reported on 05/01/2015 01/30/15 08/14/15  Reuben LikesKeller, David C, MD  metoCLOPramide (REGLAN) 10 MG tablet 1 every 12 hours as needed for migraine headache. Patient not taking: Reported on  05/01/2015 01/30/15 08/14/15  Reuben LikesKeller, David C, MD    Family History No family history on file.  Social History Social History   Tobacco Use   Smoking status: Former Smoker    Packs/day: 0.50    Types: Cigarettes    Quit date: 09/12/2016    Years since quitting: 2.7   Smokeless tobacco: Never Used  Substance Use Topics   Alcohol use: No   Drug use: No     Allergies   Patient has no known allergies.   Review of Systems Review of Systems  Constitutional: Negative for chills and fever.  HENT: Negative for facial swelling and sore throat.   Respiratory: Negative for shortness of breath.   Cardiovascular: Negative for chest pain.  Gastrointestinal: Positive for abdominal pain. Negative for diarrhea, nausea and vomiting.  Genitourinary: Negative for dysuria.  Musculoskeletal:  Positive for back pain.  Skin: Negative for rash and wound.  Neurological: Negative for headaches.  Psychiatric/Behavioral: The patient is not nervous/anxious.      Physical Exam Updated Vital Signs BP 118/74 (BP Location: Right Arm)    Pulse 81    Temp 98.1 F (36.7 C) (Oral)    Resp 16    Ht 5\' 5"  (1.651 m)    Wt 99.8 kg    LMP 06/28/2019    SpO2 100%    BMI 36.61 kg/m   Physical Exam Vitals signs and nursing note reviewed. Exam conducted with a chaperone present.  Constitutional:      General: She is not in acute distress.    Appearance: She is well-developed. She is not diaphoretic.  HENT:     Head: Normocephalic and atraumatic.     Mouth/Throat:     Pharynx: No oropharyngeal exudate.  Eyes:     General: No scleral icterus.       Right eye: No discharge.        Left eye: No discharge.     Conjunctiva/sclera: Conjunctivae normal.     Pupils: Pupils are equal, round, and reactive to light.  Neck:     Musculoskeletal: Normal range of motion and neck supple.     Thyroid: No thyromegaly.  Cardiovascular:     Rate and Rhythm: Normal rate and regular rhythm.     Heart sounds: Normal heart sounds. No murmur. No friction rub. No gallop.   Pulmonary:     Effort: Pulmonary effort is normal. No respiratory distress.     Breath sounds: Normal breath sounds. No stridor. No wheezing or rales.  Abdominal:     General: Bowel sounds are normal. There is no distension.     Palpations: Abdomen is soft.     Tenderness: There is abdominal tenderness in the right lower quadrant, suprapubic area and left lower quadrant. There is no right CVA tenderness, left CVA tenderness, guarding or rebound.  Genitourinary:    Vagina: Bleeding present.     Cervix: No cervical motion tenderness.     Uterus: Normal.      Adnexa:        Right: Tenderness present.        Left: Tenderness present.   Musculoskeletal:       Back:  Lymphadenopathy:     Cervical: No cervical adenopathy.  Skin:    General:  Skin is warm and dry.     Coloration: Skin is not pale.     Findings: No rash.  Neurological:     Mental Status: She is alert.     Coordination: Coordination  normal.      ED Treatments / Results  Labs (all labs ordered are listed, but only abnormal results are displayed) Labs Reviewed  WET PREP, GENITAL - Abnormal; Notable for the following components:      Result Value   WBC, Wet Prep HPF POC MODERATE (*)    All other components within normal limits  COMPREHENSIVE METABOLIC PANEL - Abnormal; Notable for the following components:   Glucose, Bld 109 (*)    All other components within normal limits  CBC WITH DIFFERENTIAL/PLATELET - Abnormal; Notable for the following components:   Hemoglobin 11.4 (*)    HCT 35.3 (*)    All other components within normal limits  URINALYSIS, ROUTINE W REFLEX MICROSCOPIC - Abnormal; Notable for the following components:   Color, Urine AMBER (*)    APPearance CLOUDY (*)    Hgb urine dipstick LARGE (*)    Protein, ur 100 (*)    RBC / HPF >50 (*)    All other components within normal limits  LIPASE, BLOOD  HIV ANTIBODY (ROUTINE TESTING W REFLEX)  RPR  I-STAT BETA HCG BLOOD, ED ([facility], [facility], [facility] ONLY)  GC/CHLAMYDIA PROBE AMP ([place]) NOT AT [facility]    EKG None  Radiology US Transvaginal Non-ob  Result Date: 06/30/2019 CLINICAL DATA:  Pelvic pain, left greater than right, starting this morning. EXAM: TRANSABDOMINAL ULTRASOUND OF PELVIS DOPPLER ULTRASOUND OF OVARIES TECHNIQUE: Transabdominal ultrasound examination of the pelvis was performed including evaluation of the uterus, ovaries, adnexal regions, and pelvic cul-de-sac. Color and duplex Doppler ultrasound was utilized to evaluate blood flow to the ovaries. COMPARISON:  None. FINDINGS: Uterus Measurements: 6.3 x 4.9 x 5.1 cm = volume: 82.7 mL. No fibroids or other mass visualized. The uterus is retroflexed. Endometrium Thickness: 9 mm.  No focal abnormality visualized. Right ovary Measurements: 5.1 x  2.7 x 2.1 cm = volume: 15.3 mL. Normal appearance/no adnexal mass. Left ovary Measurements: 3.2 x 1.9 x 3.1 cm = volume: 9.6 mL. Normal appearance/no adnexal mass. Pulsed Doppler evaluation demonstrates normal low-resistance arterial and venous waveforms in both ovaries. Other: Trace free fluid is identified in the pelvis. IMPRESSION: No acute abnormality in the pelvis. No evidence of ovarian torsion. Trace free fluid in the pelvis which may be physiologic. Electronically Signed   By: Sherian Rein M.D.   On: 06/30/2019 14:20   US Pelvis Complete  Result Date: 06/30/2019 CLINICAL DATA:  Pelvic pain, left greater than right, starting this morning. EXAM: TRANSABDOMINAL ULTRASOUND OF PELVIS DOPPLER ULTRASOUND OF OVARIES TECHNIQUE: Transabdominal ultrasound examination of the pelvis was performed including evaluation of the uterus, ovaries, adnexal regions, and pelvic cul-de-sac. Color and duplex Doppler ultrasound was utilized to evaluate blood flow to the ovaries. COMPARISON:  None. FINDINGS: Uterus Measurements: 6.3 x 4.9 x 5.1 cm = volume: 82.7 mL. No fibroids or other mass visualized. The uterus is retroflexed. Endometrium Thickness: 9 mm.  No focal abnormality visualized. Right ovary Measurements: 5.1 x 2.7 x 2.1 cm = volume: 15.3 mL. Normal appearance/no adnexal mass. Left ovary Measurements: 3.2 x 1.9 x 3.1 cm = volume: 9.6 mL. Normal appearance/no adnexal mass. Pulsed Doppler evaluation demonstrates normal low-resistance arterial and venous waveforms in both ovaries. Other: Trace free fluid is identified in the pelvis. IMPRESSION: No acute abnormality in the pelvis. No evidence of ovarian torsion. Trace free fluid in the pelvis which may be physiologic. Electronically Signed   By: Sherian Rein M.D.   On: 06/30/2019 14:20   [place] Art/ven Flow Abd  Pelv Doppler  Result Date: 06/30/2019 CLINICAL DATA:  Pelvic pain, left greater than right, starting this morning. EXAM: TRANSABDOMINAL ULTRASOUND OF PELVIS DOPPLER  ULTRASOUND OF OVARIES TECHNIQUE: Transabdominal ultrasound examination of the pelvis was performed including evaluation of the uterus, ovaries, adnexal regions, and pelvic cul-de-sac. Color and duplex Doppler ultrasound was utilized to evaluate blood flow to the ovaries. COMPARISON:  None. FINDINGS: Uterus Measurements: 6.3 x 4.9 x 5.1 cm = volume: 82.7 mL. No fibroids or other mass visualized. The uterus is retroflexed. Endometrium Thickness: 9 mm.  No focal abnormality visualized. Right ovary Measurements: 5.1 x 2.7 x 2.1 cm = volume: 15.3 mL. Normal appearance/no adnexal mass. Left ovary Measurements: 3.2 x 1.9 x 3.1 cm = volume: 9.6 mL. Normal appearance/no adnexal mass. Pulsed Doppler evaluation demonstrates normal low-resistance arterial and venous waveforms in both ovaries. Other: Trace free fluid is identified in the pelvis. IMPRESSION: No acute abnormality in the pelvis. No evidence of ovarian torsion. Trace free fluid in the pelvis which may be physiologic. Electronically Signed   By: Abelardo Diesel M.D.   On: 06/30/2019 14:20    Procedures Procedures (including critical care time)  Medications Ordered in ED Medications  ketorolac (TORADOL) 30 MG/ML injection 30 mg (30 mg Intravenous Given 06/30/19 1323)     Initial Impression / Assessment and Plan / ED Course  I have reviewed the triage vital signs and the nursing notes.  Pertinent labs & imaging results that were available during my care of the patient were reviewed by me and considered in my medical decision making (see chart for details).        Patient presenting with a 1 day history of left lower quadrant pain with some radiation across her abdomen.  She has no other associated symptoms except for vaginal bleeding.  She believes that this may be her second period a month.  Labs are unremarkable, except for stable chronic anemia with hemoglobin 11.4.  Urine just shows blood.  hCG negative.  GC/chlamydia, HIV, RPR sent and pending.   Wet prep shows moderate WBCs only.  Pelvic ultrasound shows no acute abnormality or evidence of torsion, but there is trace free fluid in the pelvis.  The patient's quality of pain could represent ovarian cyst rupture.  Patient is feeling much improved after single dose of Toradol in the ED.  She has no pain in the right or suprapubic area and only mild on the left after Toradol.  I do not feel any further emergent work-up indicated today and will give patient follow-up to OB/GYN.  She was given strict return precautions.  Patient will be given Mobic, Tylenol, and Zofran as needed.  Patient understands and agrees with plan.  Patient vital stable throughout ED course and discharged in satisfactory condition.  Final Clinical Impressions(s) / ED Diagnoses   Final diagnoses:  Left lower quadrant abdominal pain    ED Discharge Orders         Ordered    meloxicam (MOBIC) 7.5 MG tablet  Daily     06/30/19 1541    acetaminophen (TYLENOL) 500 MG tablet  Every 6 hours PRN     06/30/19 1541    ondansetron (ZOFRAN) 4 MG tablet  Every 6 hours     06/30/19 1541           Daliya Parchment, Bea Graff, PA-C 06/30/19 1631    Lacretia Leigh, MD 07/03/19 [phone]

## 2019-06-30 NOTE — Discharge Instructions (Addendum)
Take Mobic daily as needed for your pain.  You can alternate with Tylenol as prescribed.  Take Zofran every 6 hours as needed for nausea or vomiting.  Please follow-up with your OB/GYN as soon as possible for further evaluation and treatment of your pain.  Please return if you develop any severe localized abdominal pain unresolved by pain medication, intractable vomiting, fever, bloody stool, or any other concerning symptoms.

## 2019-06-30 NOTE — ED Triage Notes (Signed)
Pt complains of lower abd pain that started this morning. Pt denies n/v/d.

## 2019-06-30 NOTE — ED Notes (Signed)
Pt verbalized understanding of discharge and follow up instructions. IV removed and bleeding controlled. Pt ambulated to lobby with steady gait.

## 2019-07-02 LAB — HIV ANTIBODY (ROUTINE TESTING W REFLEX): HIV Screen 4th Generation wRfx: NONREACTIVE

## 2019-07-04 LAB — GC/CHLAMYDIA PROBE AMP (~~LOC~~) NOT AT ARMC
Chlamydia: NEGATIVE
Neisseria Gonorrhea: NEGATIVE

## 2019-07-06 LAB — RPR: RPR Ser Ql: NONREACTIVE

## 2019-07-07 ENCOUNTER — Other Ambulatory Visit (HOSPITAL_COMMUNITY)
Admission: RE | Admit: 2019-07-07 | Discharge: 2019-07-07 | Disposition: A | Discharge status: Home / Self Care | Payer: Medicaid Other | Source: Ambulatory Visit | Attending: Family Medicine | Admitting: Family Medicine

## 2019-07-07 ENCOUNTER — Other Ambulatory Visit: Payer: Self-pay

## 2019-07-07 ENCOUNTER — Ambulatory Visit (INDEPENDENT_AMBULATORY_CARE_PROVIDER_SITE_OTHER): Payer: Self-pay | Admitting: Family Medicine

## 2019-07-07 VITALS — BP 110/70 | HR 75 | Temp 98.6°F | Wt 214.0 lb

## 2019-07-07 DIAGNOSIS — Z0142 Encounter for cervical smear to confirm findings of recent normal smear following initial abnormal smear: Secondary | ICD-10-CM

## 2019-07-07 DIAGNOSIS — N926 Irregular menstruation, unspecified: Secondary | ICD-10-CM | POA: Insufficient documentation

## 2019-07-07 DIAGNOSIS — Z124 Encounter for screening for malignant neoplasm of cervix: Secondary | ICD-10-CM | POA: Insufficient documentation

## 2019-07-07 DIAGNOSIS — R300 Dysuria: Secondary | ICD-10-CM

## 2019-07-07 DIAGNOSIS — Z3002 Counseling and instruction in natural family planning to avoid pregnancy: Secondary | ICD-10-CM

## 2019-07-07 DIAGNOSIS — R399 Unspecified symptoms and signs involving the genitourinary system: Secondary | ICD-10-CM

## 2019-07-07 LAB — POCT UA - MICROSCOPIC ONLY

## 2019-07-07 LAB — POCT URINALYSIS DIP (MANUAL ENTRY)
Bilirubin, UA: NEGATIVE
Blood, UA: NEGATIVE
Glucose, UA: NEGATIVE mg/dL
Ketones, POC UA: NEGATIVE mg/dL
Nitrite, UA: NEGATIVE
Protein Ur, POC: NEGATIVE mg/dL
Spec Grav, UA: 1.02 (ref 1.010–1.025)
Urobilinogen, UA: 1 E.U./dL
pH, UA: 7 (ref 5.0–8.0)

## 2019-07-07 NOTE — Assessment & Plan Note (Addendum)
We will continue to monitor.  Patient encouraged to use menstrual tracking apps on her phone or via calendar. Will follow up for contraception

## 2019-07-07 NOTE — Progress Notes (Signed)
Established Patient - Clinic Visit Subjective  Subjective  Patient ID: MRN [id number]  Date of birth: May 29, 1986   PCP: Deanna Resides, MD  CC: ED follow up + resolved dysuria, irregular menses   [name] is a 33 y.o. female with past medical history significant for  LGSIL s/p Leep w/ CIN 3 and negative margins who presents today with the following problems: HPI: Abominal pain follow up in ED: Abdominal pain has resolved and has not returned.  No complaints today.  Dysuria Patient reports that she has had urinary frequency, urgency, suprapubic pressure from July 3-5.  She took over-the-counter Azo which improved her symptoms.  She reports that she has not had any repeat in symptoms.  She denies any vaginal discharge, vaginal itching.  Irregular menses Patient reports that she had a short menstrual cycle in June and longer cycle in July.  Her last cycle started on June 22 and went to July 6.  Patient reports that her periods are usually 7 days long.  The cycle prior to that was June 1-4, which was shorter than usual.  Patient is currently not on birth control but does express interest in birth control today.  Patient is also due for Pap smear after abnormal Pap and LEEP procedure in 2018.  HISTORY Medications, allergies, medical history, family history and social history were reviewed. Pertinent findings included in HPI. Social Hx: [name] reports that she quit smoking about 2 years ago. Her smoking use included cigarettes. She smoked 0.50 packs per day. She has never used smokeless tobacco. She reports that she does not drink alcohol or use drugs.  ROS: See HPI    Objective   Objective  Physical Exam:  BP 110/70   Pulse 75   Temp 98.6 F (37 C) (Oral)   Wt 214 lb (97.1 kg)   LMP 06/20/2019   BMI 35.61 kg/m  General: NAD, non-toxic, well-appearing, sitting comfortably in chair   HEENT: [place]/AT. PERRLA. EOMI.  Respiratory: CTAB. No IWOB.  Abdomen: + BS. NT, ND, soft to  palpation. No suprapubic tenderness GU: External vulva and vagina nonerythematous, without any obvious lesions or rash.  Creamy white discharge appreciated. Cervix is non erythematous and non-friable. Cervical os is closed. Os opening is not circular, rather upside down T shaped. No obvious scarring, bleeding.   Pertinent Labs & Imaging:  2011: LOW GRADE SQUAMOUS INTRAEPITHELIAL LESION: CIN-1/ VAIN-1/ HPV. (LSIL)   2018: LSIL + HPV 1. Cervix, LEEP -CERVICAL TRANSFORMATION ZONE MUCOSA WITH CIN-III (SEVERE SQUAMOUS DYSPLASIA/SQUAMOUS CARCINOMA IN SITU; HIGH GRADE SQUAMOUS INTRAEPITHELIAL LESION). -NO INVASIVE NEOPLASM IDENTIFIED. -MARGINS OF RESECTION ARE NEGATIVE FOR DYSPLASIA 2. Endocervix, LEEP -BENIGN ENDOCERVIX GLANDS AND STROMA. -NEGATIVE FOR DYSPLASIA. 3. Endocervix, curettage, post leep -BENIGN ENDOCERVICAL GLANDS.  UA: small leuks   Assessment  Assessment & Plan  Dysuria Patient reports dysuria x3 days but has since resolved.  She reports that it improved with Azo.  UA with leukocytes.  Patient to call if her symptoms return.  Can send empiric antibiotics for UTI.  If no improvement, patient knows she will have to return for urine culture.  Pap smear of cervix to confirm normal smear following abnormal smear No Pap smear obtained since LEEP procedure in 2018.  Obtain Pap on exam today.  Contraceptive management Spoke to patient about birth control methods.  She is currently not on any contraception and does not wish to get pregnant at this time.  She reports that she is favorable to the Nexplanon.  She can make an appointment to have this placed at her convenience.  Irregular menses We will continue to monitor.  Patient encouraged to use menstrual tracking apps on her phone or via calendar. Will follow up for contraception     [name], M.D.  PGY-2  Family Medicine  07/07/2019 4:40 PM

## 2019-07-07 NOTE — Progress Notes (Signed)
Stomach pain on LLQ and lower left lower back. Presented to ED with negative work up  Pain with urination  Started July 3rd and stopped on July 5th. Remembers it was very painful on July 4th, relieved with azole. Frequency, urgency. No vaginal discharge, itching. Patient typically does not drink a lot of water.    June1-4  June 22-July 6

## 2019-07-07 NOTE — Assessment & Plan Note (Signed)
Patient reports dysuria x3 days but has since resolved.  She reports that it improved with Azo.  UA with leukocytes.  Patient to call if her symptoms return.  Can send empiric antibiotics for UTI.  If no improvement, patient knows she will have to return for urine culture.

## 2019-07-07 NOTE — Patient Instructions (Signed)
Dear [name],   It was good to see you! Thank you for taking your time to come in to be seen. Today, we discussed the following:   Abdominal pain, urinary pain  I am glad your symptoms have resolved.  If your urinary symptoms return, please make an appointment for call the office  Abnormal Pap smears  We will call you regarding the results of your Pap smear when they return  Birth Control   Please make an appointment to have your Nexplanon inserted.  The appointments are typically about 20 minutes.   Be well,   Zettie Cooley, M.D   [facility] [phone]  *Sign up for MyChart for instant access to your health profile, labs, orders, upcoming appointments or to contact your provider with questions*  ===================================================================================

## 2019-07-07 NOTE — Assessment & Plan Note (Signed)
Spoke to patient about birth control methods.  She is currently not on any contraception and does not wish to get pregnant at this time.  She reports that she is favorable to the Nexplanon.  She can make an appointment to have this placed at her convenience.

## 2019-07-07 NOTE — Assessment & Plan Note (Signed)
No Pap smear obtained since LEEP procedure in 2018.  Obtain Pap on exam today.

## 2019-07-13 LAB — CYTOLOGY - PAP
Adequacy: ABSENT
Diagnosis: NEGATIVE
HPV: NOT DETECTED

## 2019-07-14 NOTE — Progress Notes (Signed)
Called patient to update her on results. She is not having any more symptoms of dysuria, thus will not treat for BV at this time. Should patient have symptoms, she should f/u at [facility].

## 2019-07-15 ENCOUNTER — Telehealth: Payer: Self-pay | Admitting: *Deleted

## 2019-07-15 DIAGNOSIS — B9689 Other specified bacterial agents as the cause of diseases classified elsewhere: Secondary | ICD-10-CM

## 2019-07-15 DIAGNOSIS — N760 Acute vaginitis: Secondary | ICD-10-CM | POA: Insufficient documentation

## 2019-07-15 MED ORDER — METRONIDAZOLE 500 MG PO TABS: 500.0000 mg | ORAL_TABLET | Freq: Two times a day (BID) | ORAL | 0 refills | Status: AC

## 2019-07-15 NOTE — Telephone Encounter (Signed)
Yes, odor and DC.  Denies pain, fever, urgency, frequency or dysuria.  Reviewed recent visits including PAP that suggests BV.  See BV problem charting.  Empiric Rx given.

## 2019-07-15 NOTE — Assessment & Plan Note (Signed)
Given pap 7/9 suggests BV and typical sx, will rx empirically. Patient knows that if the flagyl does not cure sx or if she gets worse, she will need to be seen.

## 2019-07-15 NOTE — Telephone Encounter (Signed)
Pt is now having BV symptoms, including itching and smell.  We have no openings for today.  Will forward to PCP and Dr. Maudie Mercury. Christen Bame, CMA

## 2020-09-04 ENCOUNTER — Telehealth: Payer: Self-pay

## 2020-09-04 NOTE — Telephone Encounter (Signed)
Patient calls nurse line reporting mild chest pain. Patient reports pain with deep breaths. Denies injury or muscle pulls. Pain onset approximately one week ago. Denies recent sickness or sick contacts. Denies arm pain, SHOB, no difficulty breathing and is able to speak in full sentences. Patient reports pain as "coming out of the blue" and is minor. Patient has already scheduled appointment with Dr. Leveda Anna for 9/29.   Strict ED/ return precautions given  Deanna Prude, RN

## 2020-09-26 ENCOUNTER — Ambulatory Visit (INDEPENDENT_AMBULATORY_CARE_PROVIDER_SITE_OTHER): Payer: BC Managed Care – PPO | Admitting: Family Medicine

## 2020-09-26 ENCOUNTER — Other Ambulatory Visit (HOSPITAL_COMMUNITY)
Admission: RE | Admit: 2020-09-26 | Discharge: 2020-09-26 | Disposition: A | Discharge status: Home / Self Care | Payer: BC Managed Care – PPO | Source: Ambulatory Visit | Attending: Family Medicine | Admitting: Family Medicine

## 2020-09-26 ENCOUNTER — Encounter: Payer: Self-pay | Admitting: Family Medicine

## 2020-09-26 ENCOUNTER — Other Ambulatory Visit: Payer: Self-pay

## 2020-09-26 VITALS — BP 120/78 | HR 72 | Ht 66.0 in | Wt 218.8 lb

## 2020-09-26 DIAGNOSIS — L709 Acne, unspecified: Secondary | ICD-10-CM

## 2020-09-26 DIAGNOSIS — Z0142 Encounter for cervical smear to confirm findings of recent normal smear following initial abnormal smear: Secondary | ICD-10-CM | POA: Diagnosis not present

## 2020-09-26 DIAGNOSIS — R079 Chest pain, unspecified: Secondary | ICD-10-CM | POA: Diagnosis not present

## 2020-09-26 DIAGNOSIS — Z23 Encounter for immunization: Secondary | ICD-10-CM

## 2020-09-26 NOTE — Patient Instructions (Signed)
I am not worried about your chest pain.  Your heart is normal.  Come back to see me if it comes back. For the skin problems on your shoulders, try over the counter benzoil peroxide - an acne medicine.  I think the problem is mostly blocked pores. You will get a tetanus shot today. I hope you change your mind about the flu shot and the COVID vaccine. I will call with the pap smear results.  It is important to get your paps done regularly since you have had previous problems.

## 2020-09-27 ENCOUNTER — Encounter: Payer: Self-pay | Admitting: Family Medicine

## 2020-09-27 DIAGNOSIS — L709 Acne, unspecified: Secondary | ICD-10-CM | POA: Insufficient documentation

## 2020-09-27 DIAGNOSIS — R079 Chest pain, unspecified: Secondary | ICD-10-CM | POA: Insufficient documentation

## 2020-09-27 NOTE — Progress Notes (Signed)
    SUBJECTIVE:   CHIEF COMPLAINT / HPI:   Multiple issues Chest Pain.  Two episodes.  Brief pain lasting only 30 seconds and would recur throughout the day.  No pain x 1 week.  Worse with movement.  No palpitations, diaphoresis, SOB with episodes.  No cardiac hx. Needs pap.  FU abnormal pap and (LEEP?) procedure.  Last pap one year ago normal and no high risk HPV. Skin problem shoulders. Does not want Flu or COVID vaccination despite my best attemps to convince her otherwise.     OBJECTIVE:   BP 120/78   Pulse 72   Ht 5\' 6"  (1.676 m)   Wt 218 lb 12.8 oz (99.2 kg)   LMP 09/17/2020 (Exact Date)   SpO2 96%   BMI 35.32 kg/m   Skin on shoulders, adult acne Lungs clear Cardiac RRR without m or g Abd benign Pelvic WNL - Pap taken  ASSESSMENT/PLAN:   Pap smear of cervix to confirm normal smear following abnormal smear If second pap OK this year, we can spread out interval.  Chest pain All clinical signs point to non cardiac CP which has resolved.  No further WU  Adult acne Of shoulders.  OTC benzoil peroxide.     09/19/2020, MD [facility]

## 2020-09-27 NOTE — Assessment & Plan Note (Signed)
Of shoulders.  OTC benzoil peroxide.

## 2020-09-27 NOTE — Assessment & Plan Note (Signed)
All clinical signs point to non cardiac CP which has resolved.  No further WU

## 2020-09-27 NOTE — Assessment & Plan Note (Signed)
If second pap OK this year, we can spread out interval.

## 2020-09-27 NOTE — Addendum Note (Signed)
Addended by: Georges Lynch T on: 09/27/2020 04:42 PM   Modules accepted: Orders, SmartSet

## 2020-09-28 ENCOUNTER — Telehealth: Payer: Self-pay | Admitting: Family Medicine

## 2020-09-28 LAB — CYTOLOGY - PAP
Comment: NEGATIVE
Diagnosis: NEGATIVE
High risk HPV: NEGATIVE

## 2020-09-28 MED ORDER — METRONIDAZOLE 500 MG PO TABS: 500.0000 mg | ORAL_TABLET | Freq: Two times a day (BID) | ORAL | 0 refills | Status: AC

## 2020-09-28 NOTE — Telephone Encounter (Signed)
Called and LM that PAP was normal but has trich.  Send in Rx.  Will test for other STDs next visit.

## 2020-10-05 ENCOUNTER — Other Ambulatory Visit: Payer: Self-pay

## 2020-10-05 ENCOUNTER — Other Ambulatory Visit (HOSPITAL_COMMUNITY)
Admission: RE | Admit: 2020-10-05 | Discharge: 2020-10-05 | Disposition: A | Discharge status: Home / Self Care | Payer: BC Managed Care – PPO | Source: Ambulatory Visit | Attending: Family Medicine | Admitting: Family Medicine

## 2020-10-05 ENCOUNTER — Encounter: Payer: Self-pay | Admitting: Family Medicine

## 2020-10-05 ENCOUNTER — Ambulatory Visit (INDEPENDENT_AMBULATORY_CARE_PROVIDER_SITE_OTHER): Payer: BC Managed Care – PPO | Admitting: Family Medicine

## 2020-10-05 VITALS — BP 110/68 | Ht 66.0 in | Wt 216.5 lb

## 2020-10-05 DIAGNOSIS — Z113 Encounter for screening for infections with a predominantly sexual mode of transmission: Secondary | ICD-10-CM

## 2020-10-05 NOTE — Patient Instructions (Signed)
I will let you know the results of your screening test.  As far as a test of cure, we do not always do test of cure.  If you do want to test of cure, it is not usually done for 3 months out from your treatment.

## 2020-10-05 NOTE — Progress Notes (Signed)
    SUBJECTIVE:   CHIEF COMPLAINT / HPI:   STI testing [name] was recently seen in clinic for a Pap smear.  The results of her Pap smear were notable for trichomoniasis.  She was treated with a 7-day course of metronidazole and encouraged to return to clinic for further STI testing.  She has recently had HIV and RPR testing performed.  She denies any vaginal symptoms at this time.  She has no vaginal itching or discharge to speak of.  No abdominal pain, nausea or pain with intercourse.  She completed her course of metronidazole today.  She is currently sexually active with one female partner.  PERTINENT  PMH / PSH: Recent Pap smear positive for trichomonas  OBJECTIVE:   BP 110/68   Ht 5\' 6"  (1.676 m)   Wt 216 lb 8 oz (98.2 kg)   LMP 09/17/2020 (Exact Date)   BMI 34.94 kg/m    General: Seated comfortably in the exam table.  No acute distress. Respiratory: Breathing comfortably on room air.  No respiratory distress. Abdomen: Soft, nontender to palpation. Pelvic: Pink, moist vaginal mucosa, moderate white vaginal discharge.  Samples taken.  Cervix visualized.  Bimanual exam performed without significant tenderness with cervical motion.  No notable adnexal tenderness or masses.  ASSESSMENT/PLAN:   Routine screening for STI (sexually transmitted infection) Too soon for test of cure for Trichomonas.  She was informed that test of cure always necessary she could return in 3 months if she were interested in a test of cure. -Follow-up GC/chlamydia testing -Expedited partner therapy provided for her partner     09/19/2020, MD [facility]

## 2020-10-05 NOTE — Assessment & Plan Note (Signed)
Too soon for test of cure for Trichomonas.  She was informed that test of cure always necessary she could return in 3 months if she were interested in a test of cure. -Follow-up GC/chlamydia testing -Expedited partner therapy provided for her partner

## 2020-10-07 LAB — CERVICOVAGINAL ANCILLARY ONLY
Chlamydia: NEGATIVE
Comment: NEGATIVE
Comment: NORMAL
Neisseria Gonorrhea: NEGATIVE

## 2020-10-08 ENCOUNTER — Encounter: Payer: Self-pay | Admitting: Family Medicine

## 2020-10-08 ENCOUNTER — Encounter (HOSPITAL_COMMUNITY): Payer: Self-pay | Admitting: Family Medicine

## 2020-11-01 ENCOUNTER — Telehealth: Payer: Self-pay

## 2020-11-01 MED ORDER — FLUCONAZOLE 150 MG PO TABS: 150.0000 mg | ORAL_TABLET | Freq: Once | ORAL | 0 refills | Status: AC

## 2020-11-01 NOTE — Telephone Encounter (Signed)
Single dose diflucan prescribed as requested.

## 2020-11-01 NOTE — Telephone Encounter (Signed)
Patient contacted and advised. Patient appreciative.

## 2020-11-01 NOTE — Telephone Encounter (Signed)
Patient calls nurse line requesting diflucan for presumed yeast infection. Patient reports she was given an antibiotic last month and ever since has had discharge, itching, and inflammation. Patient was seen previously for STD testing, all negative. Patient denies any new soaps or partners. Please advise on medication or if patient should be seen.

## 2021-04-15 ENCOUNTER — Encounter: Payer: Self-pay | Admitting: Family Medicine

## 2021-04-15 NOTE — Progress Notes (Signed)
Received form from patients work requesting I complete form for work accommodations.  I have not seen patient for this problem.  Tried to call.  No answer and no voicemail.  Created letter.

## 2021-08-01 DIAGNOSIS — Z20822 Contact with and (suspected) exposure to covid-19: Secondary | ICD-10-CM | POA: Diagnosis not present

## 2021-08-02 DIAGNOSIS — R350 Frequency of micturition: Secondary | ICD-10-CM | POA: Diagnosis not present

## 2021-08-02 DIAGNOSIS — R300 Dysuria: Secondary | ICD-10-CM | POA: Diagnosis not present

## 2021-08-28 ENCOUNTER — Encounter: Payer: Self-pay | Admitting: Family Medicine

## 2021-08-28 ENCOUNTER — Ambulatory Visit (INDEPENDENT_AMBULATORY_CARE_PROVIDER_SITE_OTHER): Payer: BC Managed Care – PPO | Admitting: Family Medicine

## 2021-08-28 ENCOUNTER — Other Ambulatory Visit: Payer: Self-pay

## 2021-08-28 DIAGNOSIS — F321 Major depressive disorder, single episode, moderate: Secondary | ICD-10-CM | POA: Diagnosis not present

## 2021-08-28 DIAGNOSIS — F339 Major depressive disorder, recurrent, unspecified: Secondary | ICD-10-CM | POA: Insufficient documentation

## 2021-08-28 MED ORDER — FLUOXETINE HCL 20 MG PO TABS: 20.0000 mg | ORAL_TABLET | Freq: Every day | ORAL | 3 refills | Status: DC

## 2021-08-28 NOTE — Patient Instructions (Signed)
I sent in an antidepressant medication.  Most people find it like coffee and best take it in the morning.  It takes several weeks to take effect. I am glad you are going to talk with Dr. Shawnee Knapp. See me in one month.  If things are going well, it will be an easy visit. If things are not going well, I will adjust your medicine.

## 2021-08-29 ENCOUNTER — Encounter: Payer: Self-pay | Admitting: Family Medicine

## 2021-08-29 NOTE — Assessment & Plan Note (Signed)
Major depression of 18 months duration in single mom with limited support system.  We discussed the benefits of combined counseling and drug treatment.  She agrees.  Rx fluoxetine/  Warm handoff via introduction to Dr. Shawnee Knapp.

## 2021-08-29 NOTE — Progress Notes (Signed)
    SUBJECTIVE:   CHIEF COMPLAINT / HPI:   Depression.   Patient states that she has had a depressed mood for ~1.5 years.  Feels "stuck." Cries "all the time."  Single parent of two teenagers: her son, Thurston Pounds, and a niece, who "lost her mom."  [name]'s brother frequently stays with them but does not contribute financially.  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    Thinks she may have been on antidepressant meds in the remote past - I could not find any record.  She has not active suicidal thoughts.  Did take pills as a teenager in a half hearted attempt.  Her children are a strong preventive factor.    OBJECTIVE:   BP (!) 122/94   Ht 5\' 6"  (1.676 m)   Wt 220 lb 9.6 oz (100.1 kg)   LMP 08/19/2021 (Exact Date)   BMI 35.61 kg/m   Lungs clear Cardiac RRR without m or g Mood, tearful, sad.  Normal cognition    ASSESSMENT/PLAN:   Depression, major, single episode, moderate (HCC) Major depression of 18 months duration in single mom with limited support system.  We discussed the benefits of combined counseling and drug treatment.  She agrees.  Rx fluoxetine/  Warm handoff via introduction to Dr. 08/21/2021.     Shawnee Knapp, MD [facility]

## 2021-09-09 ENCOUNTER — Ambulatory Visit (INDEPENDENT_AMBULATORY_CARE_PROVIDER_SITE_OTHER): Payer: BC Managed Care – PPO | Admitting: Psychology

## 2021-09-09 ENCOUNTER — Other Ambulatory Visit: Payer: Self-pay

## 2021-09-09 DIAGNOSIS — F321 Major depressive disorder, single episode, moderate: Secondary | ICD-10-CM | POA: Diagnosis not present

## 2021-09-09 NOTE — BH Specialist Note (Signed)
Integrated Behavioral Health Initial In-Person Visit  MRN: [id number] Name: AUDRIANA ALDAMA  Number of Integrated Behavioral Health Clinician visits:: 1/6 Session Start time: 330  Session End time: 415 Total time: 45  minutes  Types of Service: Individual psychotherapy   Subjective: MAKENSIE MULHALL is a 35 y.o. female  Patient was referred by Dr. Leveda Anna for depression. Patient reports the following symptoms/concerns: Pt shared she has had syx of depression for a while. Reported stressors include financials and caring for two children as single mom. Pt reported she wouild like able to process emotions before they build up.  Discussed processing emotions in journal and finding 10 min for self-care. Duration of problem: acutely past few months; Severity of problem: moderate  Objective: Mood: Depressed and Affect: Tearful Risk of harm to self or others: No plan to harm self or others  Life Context: Family and Social: single mom School/Work: works in Clinical biochemist   Patient and/or Family's Strengths/Protective Factors: Sense of purpose  Goals Addressed: Patient will: Reduce symptoms of: depression: feeling overwhelmed; sad; overeating; sleep increased to cope Increase knowledge and/or ability of: coping skills: journaling and self-care  Progress towards Goals: Ongoing  Interventions: Interventions utilized: CBT Cognitive Behavioral Therapy and Supportive Reflection  Standardized Assessments completed: Not Needed  Patient and/or Family Response: Pt engaged in treatment planning  Patient Centered Plan: Patient is on the following Treatment Plan(s):  depression treatment plan  Assessment: Patient currently experiencing depressive syx due to life changes and stressful circumstances.   Patient may benefit from CBT.  Plan: Follow up with behavioral health clinician on : 2 weeks Behavioral recommendations: write feelings out and self-care Referral(s): Integrated  [employer] (In Clinic)   Royetta Asal, PhD., LMFT

## 2021-09-24 ENCOUNTER — Other Ambulatory Visit: Payer: Self-pay

## 2021-09-24 ENCOUNTER — Ambulatory Visit (INDEPENDENT_AMBULATORY_CARE_PROVIDER_SITE_OTHER): Payer: BC Managed Care – PPO | Admitting: Psychology

## 2021-09-24 DIAGNOSIS — F321 Major depressive disorder, single episode, moderate: Secondary | ICD-10-CM

## 2021-09-25 ENCOUNTER — Encounter: Payer: Self-pay | Admitting: Family Medicine

## 2021-09-25 ENCOUNTER — Ambulatory Visit (INDEPENDENT_AMBULATORY_CARE_PROVIDER_SITE_OTHER): Payer: BC Managed Care – PPO | Admitting: Family Medicine

## 2021-09-25 ENCOUNTER — Other Ambulatory Visit: Payer: Self-pay

## 2021-09-25 DIAGNOSIS — F321 Major depressive disorder, single episode, moderate: Secondary | ICD-10-CM | POA: Diagnosis not present

## 2021-09-25 NOTE — BH Specialist Note (Signed)
Integrated Behavioral Health Follow Up In-Person Visit  MRN: [id number] Name: [name]  Number of Integrated Behavioral Health Clinician visits: 2/6 Session Start time: 330  Session End time: 4 Total time: 30 minutes  Types of Service: Individual psychotherapy  Subjective: [name] is a 35 y.o. female  Patient was referred by Dr. Leveda Anna for depression. Patient reports the following symptoms/concerns: PT shared she has been feeling overwhelmed. PT shared she feels she is constantly worrying about others. Discussed comfort of being in this role for her.  Discussed importance of giving back to self to be able to give to others. Pt shared she wrote in her journal and it was helpful.  Discussed finding 10 min a day for listening to music.  Duration of problem: acutely past few months; Severity of problem: moderate  Objective: Mood: Depressed and Affect: Tearful Risk of harm to self or others: Suicidal ideation : passive SI; pt reported no plan and no intent; pt declined crisis hotline  Life Context: Family and Social: single mom School/Work: works in Clinical biochemist     Patient and/or Family's Strengths/Protective Factors: Sense of purpose   Goals Addressed: Patient will: Reduce symptoms of: depression: feeling overwhelmed; sad; overeating; sleep increased to cope Increase knowledge and/or ability of: coping skills: journaling and self-care   Progress towards Goals: Ongoing   Interventions: Interventions utilized: CBT Cognitive Behavioral Therapy and Supportive Reflection  Standardized Assessments completed: Not Needed   Patient and/or Family Response: Pt engaged in treatment planning   Patient Centered Plan: Patient is on the following Treatment Plan(s):  depression treatment plan   Assessment: Patient currently experiencing depressive syx due to life changes and stressful circumstances.   Patient may benefit from CBT.   Plan: Follow up with behavioral  health clinician on : 2 weeks Behavioral recommendations: write feelings out and self-care Referral(s): Integrated [employer] (In Clinic)     Royetta Asal, PhD., LMFT

## 2021-09-25 NOTE — Patient Instructions (Addendum)
Wait another couple of weeks.  Lets say somewhere around Oct 10th.  If you still haven't noticed any improvement, we will bump you up to 40 mg.  Call my office and I will send in a new script.  While you are waiting for the new prescription, you can take two of the 20 mg pills.   Plan on calling me sometime around Oct 10.Marland Kitchen

## 2021-09-26 ENCOUNTER — Encounter: Payer: Self-pay | Admitting: Family Medicine

## 2021-09-26 NOTE — Progress Notes (Signed)
    SUBJECTIVE:   CHIEF COMPLAINT / HPI:   FU depression.  Please see my last office visit note and Dr. Lacie Draft notes.  Not much benefit yet seen from prozac.  She has been on the current dose for 3.5-4 weeks. Not surprising given her life stressors and lack of social/family support.  We did have a nice discussion about her mom, Lupita Leash, who was also my patient.  Lupita Leash was a wonderful person.  Anique misses her dearly and could use her support right now.  Still has thoughts of going to sleep and not waking up.  No plans.  Passive, not active ideation.    OBJECTIVE:   Pulse 80   Ht 5\' 6"  (1.676 m)   Wt 217 lb 12.8 oz (98.8 kg)   LMP 09/17/2021   BMI 35.15 kg/m   Tearful in office but engages nicely in conversation and is rational throughout.  ASSESSMENT/PLAN:   Depression, major, single episode, moderate (HCC) Stable without much improvement.  If lack of improvement continues for another couple of weeks, I will increase prozac to 40 mg daily.     09/19/2021, MD [facility]

## 2021-09-26 NOTE — Assessment & Plan Note (Signed)
Stable without much improvement.  If lack of improvement continues for another couple of weeks, I will increase prozac to 40 mg daily.

## 2021-10-08 ENCOUNTER — Other Ambulatory Visit: Payer: Self-pay

## 2021-10-08 ENCOUNTER — Other Ambulatory Visit: Payer: Self-pay | Admitting: Family Medicine

## 2021-10-08 ENCOUNTER — Ambulatory Visit (INDEPENDENT_AMBULATORY_CARE_PROVIDER_SITE_OTHER): Payer: BC Managed Care – PPO | Admitting: Psychology

## 2021-10-08 DIAGNOSIS — F321 Major depressive disorder, single episode, moderate: Secondary | ICD-10-CM

## 2021-10-08 MED ORDER — FLUOXETINE HCL 40 MG PO CAPS: 40.0000 mg | ORAL_CAPSULE | Freq: Every day | ORAL | 3 refills | Status: DC

## 2021-10-08 NOTE — Progress Notes (Signed)
Patient has had only a partial response to prozac 20  No side effects.  Increase dose to prozac 40 mg daily.

## 2021-10-09 NOTE — BH Specialist Note (Signed)
Integrated Behavioral Health Follow Up In-Person Visit  MRN: [id number] Name: [name]  Number of Integrated Behavioral Health Clinician visits: 3/6 Session Start time: 330  Session End time: 4 Total time: 30 minutes  Types of Service: Individual psychotherapy   Subjective: [name] is a 35 y.o. female  Patient was referred by Dr. Leveda Anna for depression. Patient reports the following symptoms/concerns: PT shared she feels her mood has been more stable in the past week. Pt shared stressors of parenting and having her niece adjust to her house. Encouraged her to communicate and discuss with niece's therapist ways to support her in transition into home.   Pt shared she has been using coping skills and they ground her. Next prompt in journal is to come up with her strengths.   Talked to Dr. Leveda Anna during this visit about increasing medication per pt request.   Duration of problem: acutely past few months; Severity of problem: moderate  Objective: Mood: Euthymic and Affect: Appropriate Risk of harm to self or others: No plan to harm self or others   Life Context: Family and Social: single mom School/Work: works in Clinical biochemist     Patient and/or Family's Strengths/Protective Factors: Sense of purpose   Goals Addressed: Patient will: Reduce symptoms of: depression: feeling overwhelmed; sad; overeating; sleep increased to cope Increase knowledge and/or ability of: coping skills: journaling and self-care   Progress towards Goals: Ongoing   Interventions: Interventions utilized: CBT Cognitive Behavioral Therapy and Supportive Reflection  Standardized Assessments completed: Not Needed   Patient and/or Family Response: Pt engaged in treatment planning   Patient Centered Plan: Patient is on the following Treatment Plan(s):  depression treatment plan   Assessment: Patient currently experiencing depressive syx due to life changes and stressful  circumstances.   Patient may benefit from CBT.   Plan: Follow up with behavioral health clinician on : 2 weeks Behavioral recommendations: journaling and self-care Referral(s): Integrated [employer] (In Clinic)     Royetta Asal, PhD., LMFT

## 2021-10-22 ENCOUNTER — Ambulatory Visit: Payer: BC Managed Care – PPO | Admitting: Psychology

## 2021-10-29 ENCOUNTER — Other Ambulatory Visit: Payer: Self-pay

## 2021-10-29 ENCOUNTER — Ambulatory Visit (INDEPENDENT_AMBULATORY_CARE_PROVIDER_SITE_OTHER): Payer: BC Managed Care – PPO | Admitting: Psychology

## 2021-10-29 DIAGNOSIS — F321 Major depressive disorder, single episode, moderate: Secondary | ICD-10-CM

## 2021-10-29 NOTE — BH Specialist Note (Signed)
Integrated Behavioral Health Follow Up In-Person Visit  MRN: [id number] Name: [name]  Number of Integrated Behavioral Health Clinician visits: 4/6 Session Start time: 315  Session End time: 4 Total time: 45  minutes  Types of Service: Individual psychotherapy   Subjective: [name] is a 35 y.o. female  Patient was referred by Dr. Leveda Anna for depression. Patient reports the following symptoms/concerns: Pt reported it was difficult for her to come up with her strengths for her journal prompt. Pt shared her sadness and feeling "numb" with living in the same place for the past 12 years. Pt shared she is living on the couch while she cares for her niece. Pt shared she has difficulty finding happiness.     Processed emotions and highlighted strengths.    Duration of problem: acutely past few months; Severity of problem: moderate   Objective: Mood: Euthymic and Affect: Appropriate Risk of harm to self or others: No plan to harm self or others; pt has hotline if needed and feels comfortable using it     Life Context: Family and Social: single mom School/Work: works in Clinical biochemist     Patient and/or Family's Strengths/Protective Factors: Sense of purpose   Goals Addressed: Patient will: Reduce symptoms of: depression: feeling overwhelmed; sad; overeating; sleep increased to cope Increase knowledge and/or ability of: coping skills: journaling and self-care   Progress towards Goals: Ongoing   Interventions: Interventions utilized: CBT Cognitive Behavioral Therapy and Supportive Reflection  Standardized Assessments completed: Not Needed   Patient and/or Family Response: Pt engaged in treatment planning   Patient Centered Plan: Patient is on the following Treatment Plan(s):  depression treatment plan   Assessment: Patient currently experiencing depressive syx due to life changes and stressful circumstances.   Patient may benefit from CBT.    Plan: Follow up with behavioral health clinician on : 2 weeks Behavioral recommendations: journaling and self-care Referral(s): Integrated [employer] (In Clinic)     Royetta Asal, PhD., LMFT

## 2021-11-12 ENCOUNTER — Other Ambulatory Visit: Payer: Self-pay

## 2021-11-12 ENCOUNTER — Ambulatory Visit (INDEPENDENT_AMBULATORY_CARE_PROVIDER_SITE_OTHER): Payer: BC Managed Care – PPO | Admitting: Psychology

## 2021-11-12 DIAGNOSIS — F321 Major depressive disorder, single episode, moderate: Secondary | ICD-10-CM | POA: Diagnosis not present
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
## 2021-12-02 ENCOUNTER — Ambulatory Visit: Payer: BC Managed Care – PPO | Admitting: Psychology

## 2022-05-12 ENCOUNTER — Ambulatory Visit (INDEPENDENT_AMBULATORY_CARE_PROVIDER_SITE_OTHER): Payer: BC Managed Care – PPO | Admitting: Psychology

## 2022-05-12 ENCOUNTER — Telehealth: Payer: Self-pay | Admitting: Psychology

## 2022-05-12 DIAGNOSIS — F321 Major depressive disorder, single episode, moderate: Secondary | ICD-10-CM

## 2022-05-12 NOTE — BH Specialist Note (Signed)
Integrated Behavioral Health Follow Up In-Person Visit ? ?MRN: [id number] ?Name: [name] ? ?Number of Integrated Behavioral Health Clinician visits: 6-Sixth Visit ? ?Session Start time: 1100 ?  ?Session End time: 1130 ? ?Total time in minutes: 30 ? ? ?Types of Service: Individual psychotherapy ? ? ?Subjective: ?[name] is a 36 y.o. female  ?Patient was referred by Dr. Leveda Anna for depression. ?Patient reports the following symptoms/concerns: Pt reported decrease in mood.  Reported she has not been feeling motivated and has been tired. Became tearful about the loss of her mother 12 years ago. Reports her niece has also left the home after living with her. Process emotions. Engaged in breathing exercise. Talked about processing loss. Discussed beginning journaling again. ? ?Reported just restarted anti-depressant prescribed by Dr. Leveda Anna. Discussed patient's need for FMLA and put paperwork in Dr. Cyndia Skeeters box. ?Duration of problem: more than 6 months; Severity of problem: moderate ? ?Objective: ?Mood: Depressed and Affect: Tearful ?Risk of harm to self or others: Suicidal ideation: passive SI; denied plan and intent; reported has hotline number  ? ?Life Context: ?Family and Social: single mom  ?School/Work: works at [employer] ? ? ?Patient and/or Family's Strengths/Protective Factors: ?Concrete supports in place (healthy food, safe environments, etc.) ? ?Goals Addressed: ?Patient will: ? Reduce symptoms of: depression : low mood; passive SI; tiredness ? Increase knowledge and/or ability of: coping skills : processing emotions; journaling  ? ?Progress towards Goals: ?Ongoing ? ?Interventions: ?Interventions utilized:  CBT Cognitive Behavioral Therapy and Supportive Counseling ?Standardized Assessments completed: Not Needed ? ?Patient and/or Family Response: Pt engaged in treatment plan ? ?Patient Centered Plan: ?Patient is on the following Treatment Plan(s): depression treatment plan ?Assessment: ?Patient  currently experiencing depressed mood due to stress in life and loss.  ? ?Patient may benefit from CBT and supportive counseling. ? ?Plan: ?Follow up with behavioral health clinician on : 1 week ?Behavioral recommendations: journaling  ?Referral(s): [facility] (In Clinic) ? ? ?Royetta Asal, PhD., LMFT ? ? ? ? ? ?

## 2022-05-12 NOTE — Telephone Encounter (Signed)
Pt called reporting increased depression and feeling overwhelmed. Reported that she has passive SI no plan and no intent.  Scheduled appt for today at 11. ? ?Pt is requesting FMLA for Dr. Leveda Anna to fill out. Paperwork will be brought to appt today. ? ? ?Royetta Asal, PhD., LMFT ? ?

## 2022-05-14 ENCOUNTER — Telehealth: Payer: Self-pay | Admitting: Psychology

## 2022-05-14 NOTE — Telephone Encounter (Signed)
Spoke with pt and checked in. Endorsed passive SI. Talked through using hotline if worsens or calling clinic during business hours to talk to me.  ? ?Pt asked about FMLA forms and informed her Dr. Andria Frames would be back next week to assist with them. ? ?Erlinda Hong, PhD., LMFT ? ?

## 2022-05-19 ENCOUNTER — Ambulatory Visit (INDEPENDENT_AMBULATORY_CARE_PROVIDER_SITE_OTHER): Payer: BC Managed Care – PPO | Admitting: Psychology

## 2022-05-19 ENCOUNTER — Encounter: Payer: Self-pay | Admitting: Family Medicine

## 2022-05-19 DIAGNOSIS — F321 Major depressive disorder, single episode, moderate: Secondary | ICD-10-CM | POA: Diagnosis not present

## 2022-05-19 NOTE — BH Specialist Note (Signed)
Integrated Behavioral Health Follow Up In-Person Visit  MRN: [id number] Name: [name]  Number of Integrated Behavioral Health Clinician visits: Additional Visit  Session Start time: 1110   Session End time: 1130  Total time in minutes: 20   Types of Service: Individual psychotherapy   Subjective: [name] is a 36 y.o. female  Patient was referred by Dr. Leveda Anna for depression. Patient reports the following symptoms/concerns: Pt continue to report depressive mood. Reports lack of motivation and tiredness.  Discussed ways to combat mood such as journaling.   Discussed how mood impacts her day to day. Reports she feels like she doesn't have other to go to.    Discussed trying to journal and creating structure during the day to avoid feeling more depressed. Duration of problem: more than 6 months; Severity of problem: moderate  Objective: Mood: Euthymic and Affect: Appropriate Risk of harm to self or others: Suicidal ideation: passive SI; no intent no plan; son protective factor   Life Context: Family and Social: single mom School/Work: works at [employer]   Patient and/or Family's Strengths/Protective Factors: Concrete supports in place (healthy food, safe environments, etc.)  Goals Addressed: Patient will:  Reduce symptoms of: depression: low mood; passive SI; low motivation and tired   Increase knowledge and/or ability of: coping skills : journaling and processing emotions    Progress towards Goals: Ongoing  Interventions: Interventions utilized:  Supportive Counseling and Supportive Reflection Standardized Assessments completed: Not Needed  Patient and/or Family Response: Pt engaged in treatment planning   Patient Centered Plan: Patient is on the following Treatment Plan(s): depression treatment plan Assessment: Patient currently experiencing depression related to stress and loss.   Patient may benefit from CBT and supportive counseling  .  Plan: Follow up with behavioral health clinician on : 1 week Behavioral recommendations: continue trying to journal, create structure in day  Referral(s): [facility] (In Clinic)   Royetta Asal, PhD., LMFT

## 2022-05-19 NOTE — Telephone Encounter (Signed)
Called patient and also discussed with Dr. Shawnee Knapp.  Patient had done well with depression and had self discontinued her prozac months ago.  The depressive symptoms resurfaced a week or more prior to her missing her first day of work on 05/09/22.  She was seen by Dr. Shawnee Knapp in our office on 05/12/22.  She was quite distraught during that visit.  Dr. Shawnee Knapp plans to see her weekly.  She has restarted her prozac at the previously effective dose.  I asked [name] to make an appointment with me in ~10 days.  I filled out the disability paperwork the best I could.  She works in Clinical biochemist.  I support that she is unable to perform her job in her current emotional state.

## 2022-05-29 ENCOUNTER — Ambulatory Visit (INDEPENDENT_AMBULATORY_CARE_PROVIDER_SITE_OTHER): Payer: BC Managed Care – PPO | Admitting: Psychology

## 2022-05-29 DIAGNOSIS — F331 Major depressive disorder, recurrent, moderate: Secondary | ICD-10-CM
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
## 2022-06-02 ENCOUNTER — Ambulatory Visit (INDEPENDENT_AMBULATORY_CARE_PROVIDER_SITE_OTHER): Payer: BC Managed Care – PPO | Admitting: Family Medicine

## 2022-06-02 ENCOUNTER — Encounter: Payer: Self-pay | Admitting: Family Medicine

## 2022-06-02 ENCOUNTER — Ambulatory Visit (INDEPENDENT_AMBULATORY_CARE_PROVIDER_SITE_OTHER): Payer: BC Managed Care – PPO | Admitting: Psychology

## 2022-06-02 DIAGNOSIS — F331 Major depressive disorder, recurrent, moderate: Secondary | ICD-10-CM

## 2022-06-02 DIAGNOSIS — F321 Major depressive disorder, single episode, moderate: Secondary | ICD-10-CM

## 2022-06-02 MED ORDER — FLUOXETINE HCL 40 MG PO CAPS: 40.0000 mg | ORAL_CAPSULE | Freq: Every day | ORAL | 3 refills | Status: DC

## 2022-06-02 NOTE — BH Specialist Note (Unsigned)
Integrated Behavioral Health Follow Up In-Person Visit  MRN: [id number] Name: [name]  Number of Integrated Behavioral Health Clinician visits: Additional Visit  Session Start time: [phone]   Session End time: 0945  Total time in minutes: 35   Types of Service: Individual psychotherapy   Subjective: [name] is a 36 y.o. female  Patient was referred by Dr. Leveda Anna for depression. Patient reports the following symptoms/concerns: Pt reported depressed mood has no change.  Reported passive SI continued. Denied plan and intent. Engaged in talking about core beliefs and labeled them.  Discussed hw was to write a challenge to this cognitive belief. Duration of problem: more than 6 months; Severity of problem: severe  Objective: Mood: Depressed and Affect: Tearful Risk of harm to self or others: Suicidal ideation : passive SI; denied plan and intent; will call hotline and speak with me if worsens   Life Context: Family and Social: *** School/Work: *** Self-Care: *** Life Changes: ***  Patient and/or Family's Strengths/Protective Factors: {CHL AMB BH PROTECTIVE FACTORS:[phone]}  Goals Addressed: Patient will:  Reduce symptoms of: {IBH Symptoms:21014056}   Increase knowledge and/or ability of: {IBH Patient Tools:21014057}   Demonstrate ability to: {IBH Goals:21014053}  Progress towards Goals: {CHL AMB BH PROGRESS TOWARDS GOALS:[phone]}  Interventions: Interventions utilized:  {IBH Interventions:21014054} Standardized Assessments completed: {IBH Screening Tools:21014051}  Patient and/or Family Response: ***  Patient Centered Plan: Patient is on the following Treatment Plan(s): *** Assessment: Patient currently experiencing ***.   Patient may benefit from ***.  Plan: Follow up with behavioral health clinician on : *** Behavioral recommendations: *** Referral(s): {IBH Referrals:21014055} "From scale of 1-10, how likely are you to follow plan?":  ***  Alan Mulder, LMFT

## 2022-06-02 NOTE — Patient Instructions (Signed)
I will give you a letter for your job I sent in a prescription for the higher dose of prozac/fluoxetine.   See me again this month.   Let me know if I need to make any referrals for your work. Keep seeing Dr. Shawnee Knapp.

## 2022-06-03 ENCOUNTER — Encounter: Payer: Self-pay | Admitting: Family Medicine

## 2022-06-03 NOTE — Progress Notes (Signed)
    SUBJECTIVE:   CHIEF COMPLAINT / HPI:   FU depression.  Patient I am jointly managing with Dr. Hartford Poli is not doing well from either of our perspectives.  Kerilynn had several minutes of hard sobbing and being unable to speak when I first entered the room.  It has also been Dr. Shirlyn Goltz experience that Dhara's depression remains uncontroled.  Once she could talk we discussed the following. Having passive suicidal thoughts.  No active plan.  Present for weeks. Strong thoughts about her deceased mother.  We discussed anniversay reactions.  Her mother's birthday is later this month. Agrees she needs higher dose of prozac.  Upped the dose today. We agree she cannot yet go back to work.  Letter supplied with anticipated date of return in 4 weeks to give higher dose of prozac time to take effect.  If she has other requirements from work, I will do my best to help her meet those HR expectations. She mentioned briefly that she needs to make an appointment with me for mennorrhagia.  We did not address that issue this visit.    OBJECTIVE:   BP 117/70   Pulse 79   Ht 5\' 6"  (1.676 m)   Wt 224 lb 6.4 oz (101.8 kg)   LMP 05/15/2022   SpO2 100%   BMI 36.22 kg/m   Initially distraught.  Calmed down and had reasonable converstation.  Affect was sad throughout.  No thought disruption.    ASSESSMENT/PLAN:   Major depression, recurrent ([place]) Not improved.  Increase prozac.  Continue counseling.  Letter provided for work.  FU 2-3 weeks with me for depression.  Also follow up with me for menorrhagia.     Zenia Resides, MD [place]

## 2022-06-03 NOTE — Assessment & Plan Note (Signed)
Not improved.  Increase prozac.  Continue counseling.  Letter provided for work.  FU 2-3 weeks with me for depression.  Also follow up with me for menorrhagia.

## 2022-06-09 ENCOUNTER — Ambulatory Visit (INDEPENDENT_AMBULATORY_CARE_PROVIDER_SITE_OTHER): Payer: BC Managed Care – PPO | Admitting: Psychology

## 2022-06-09 DIAGNOSIS — F331 Major depressive disorder, recurrent, moderate: Secondary | ICD-10-CM

## 2022-06-09 NOTE — BH Specialist Note (Signed)
Integrated Behavioral Health Follow Up In-Person Visit  MRN: [id number] Name: [name]  Number of [facility] Clinician visits: Additional Visit  Session Start time: 1000   Session End time: 1030  Total time in minutes: 30   Types of Service: Individual psychotherapy  Subjective: [name] is a 36 y.o. female  Patient was referred by Dr. Andria Frames for depression. Patient reports the following symptoms/concerns: Pt reported back her assigned hw of challenging of of her cognitive beliefs which was "needing to conceal her emotions".  Discussed alternative thoughts and why this belief exist.  Discussed definition of grace and how it can be a part of challenging thoughts.  Another assignment given for challenging another belief.  Duration of problem: more than 6 months; Severity of problem: severe  Objective: Mood: Depressed and Affect: Tearful Risk of harm to self or others: Suicidal ideation: reported decreased passive SI; Denied HI  Life Context: Family and Social: single mom School/Work: woks at [employer]  Patient and/or Family's Strengths/Protective Factors: Concrete supports in place (healthy food, safe environments, etc.)  Goals Addressed: Patient will:  Reduce symptoms of: depression: passive SI; lack of motivation; tearful; low mood  Increase knowledge and/or ability of: coping skills : challenging core beliefs that are impacting depression   Progress towards Goals: Ongoing  Interventions: Interventions utilized:  CBT Cognitive Behavioral Therapy, Medication Monitoring, and Supportive Counseling Standardized Assessments completed: Not Needed  Patient and/or Family Response: Pt engaged in treatment plan  Patient Centered Plan: Patient is on the following Treatment Plan(s): depression treatment plan Assessment: Patient currently experiencing depressed mood due to stress in life and loss.   Patient may benefit from CBT and supportive  counseling.  Plan: Follow up with behavioral health clinician on : 1 week Behavioral recommendations: challenge another core belief impacting depression Referral(s): [facility] (In Clinic)  Erlinda Hong, PhD., LMFT

## 2022-06-13 ENCOUNTER — Telehealth: Payer: Self-pay

## 2022-06-13 NOTE — Telephone Encounter (Signed)
Patient LVM on nurse line regarding short term disability paperwork questions. She also mentioned a referral to a psychologist.   Attempted to call patient back, no answer and VM box full.   Will attempt to return call at later date.   Veronda Prude, RN

## 2022-06-16 ENCOUNTER — Ambulatory Visit (INDEPENDENT_AMBULATORY_CARE_PROVIDER_SITE_OTHER): Payer: BC Managed Care – PPO | Admitting: Psychology

## 2022-06-16 DIAGNOSIS — F331 Major depressive disorder, recurrent, moderate: Secondary | ICD-10-CM | POA: Diagnosis not present
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
## 2022-06-19 ENCOUNTER — Emergency Department (HOSPITAL_COMMUNITY): Payer: BC Managed Care – PPO

## 2022-06-19 ENCOUNTER — Emergency Department (HOSPITAL_COMMUNITY)
Admission: EM | Admit: 2022-06-19 | Discharge: 2022-06-19 | Disposition: A | Discharge status: Home / Self Care | Payer: BC Managed Care – PPO | Attending: Emergency Medicine | Admitting: Emergency Medicine

## 2022-06-19 ENCOUNTER — Encounter (HOSPITAL_COMMUNITY): Payer: Self-pay | Admitting: Emergency Medicine

## 2022-06-19 DIAGNOSIS — Y92410 Unspecified street and highway as the place of occurrence of the external cause: Secondary | ICD-10-CM | POA: Diagnosis not present

## 2022-06-19 DIAGNOSIS — S161XXA Strain of muscle, fascia and tendon at neck level, initial encounter: Secondary | ICD-10-CM

## 2022-06-19 DIAGNOSIS — Z041 Encounter for examination and observation following transport accident: Secondary | ICD-10-CM | POA: Diagnosis not present

## 2022-06-19 DIAGNOSIS — S0990XA Unspecified injury of head, initial encounter: Secondary | ICD-10-CM | POA: Diagnosis not present

## 2022-06-19 DIAGNOSIS — M545 Low back pain, unspecified: Secondary | ICD-10-CM | POA: Diagnosis not present

## 2022-06-19 DIAGNOSIS — S199XXA Unspecified injury of neck, initial encounter: Secondary | ICD-10-CM | POA: Diagnosis not present

## 2022-06-19 DIAGNOSIS — M542 Cervicalgia: Secondary | ICD-10-CM | POA: Diagnosis not present

## 2022-06-19 MED ORDER — METHOCARBAMOL 500 MG PO TABS
500.0000 mg | ORAL_TABLET | Freq: Once | ORAL | Status: AC
  Administered 2022-06-19: 500 mg via ORAL
  Filled 2022-06-19: qty 1

## 2022-06-19 MED ORDER — METHOCARBAMOL 500 MG PO TABS: 500.0000 mg | ORAL_TABLET | Freq: Two times a day (BID) | ORAL | 0 refills | Status: DC

## 2022-06-19 MED ORDER — ACETAMINOPHEN 500 MG PO TABS
1000.0000 mg | ORAL_TABLET | ORAL | Status: AC
  Administered 2022-06-19: 1000 mg via ORAL
  Filled 2022-06-19: qty 2

## 2022-06-19 NOTE — Discharge Instructions (Addendum)
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
## 2022-06-19 NOTE — ED Notes (Signed)
Pt verbalized understanding of d/c instructions, meds, and followup care. Denies questions. VSS, no distress noted. Steady gait to exit with all belongings.  ?
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
## 2022-06-20 ENCOUNTER — Telehealth: Payer: Self-pay | Admitting: Psychology

## 2022-06-24 ENCOUNTER — Ambulatory Visit (INDEPENDENT_AMBULATORY_CARE_PROVIDER_SITE_OTHER): Payer: BC Managed Care – PPO | Admitting: Psychology

## 2022-06-24 DIAGNOSIS — F331 Major depressive disorder, recurrent, moderate: Secondary | ICD-10-CM

## 2022-06-30 ENCOUNTER — Ambulatory Visit (INDEPENDENT_AMBULATORY_CARE_PROVIDER_SITE_OTHER): Payer: BC Managed Care – PPO | Admitting: Family Medicine

## 2022-06-30 ENCOUNTER — Encounter: Payer: Self-pay | Admitting: Family Medicine

## 2022-06-30 DIAGNOSIS — F331 Major depressive disorder, recurrent, moderate: Secondary | ICD-10-CM | POA: Diagnosis not present

## 2022-06-30 MED ORDER — QUETIAPINE FUMARATE 25 MG PO TABS: 25.0000 mg | ORAL_TABLET | Freq: Every day | ORAL | 5 refills | Status: DC

## 2022-06-30 NOTE — Assessment & Plan Note (Addendum)
Add low dose seroquel to her fluoxetine.   Refer to psychologist as required by her insurance.  Heavy sigh.   FU by phone in two weeks, in person in four weeks.   Due to setback, she is not able to return to work at this time.

## 2022-06-30 NOTE — Patient Instructions (Addendum)
Please check the psychology today Find a Therapist website.  It is very user friendly.  Let me know if you need a referral.  I am happy to put it in.  I do know Dr. Spero Geralds very well and recommend her highly. I sent in a prescription for a low dose of a second medication.  Please call me in about 2 weeks to tell me how its working.  See again in about a month.

## 2022-06-30 NOTE — Progress Notes (Signed)
    SUBJECTIVE:   CHIEF COMPLAINT / HPI:   FU depression: Patient has suffered a setback.  She tells me her claim for short term disability was denied because Dr. Shawnee Knapp was not a psychologist or psychiatrist.  This is highly unfortunate.  She and Dr. Shawnee Knapp had established a good connection and Dr. Shawnee Knapp and I are able to coordinate care.    She feels worse than last visit.  "I am am more emotional."  Finds herself breaking down into tears on a daily basis.  No SI or HI.  Very frustrated.    OBJECTIVE:   BP 120/81   Pulse 85   Ht 5\' 6"  (1.676 m)   Wt 215 lb 6.4 oz (97.7 kg)   LMP 06/28/2022   SpO2 100%   BMI 34.77 kg/m   Affect is sad and cognitive function is normal throughout visit.    ASSESSMENT/PLAN:   Major depression, recurrent (HCC) Add low dose seroquel to her fluoxetine.   Refer to psychologist as required by her insurance.  Heavy sigh.   FU by phone in two weeks, in person in four weeks.   Due to setback, she is not able to return to work at this time.     08/29/2022, MD [facility]

## 2022-07-03 ENCOUNTER — Telehealth: Payer: Self-pay | Admitting: Psychology

## 2022-07-03 NOTE — Telephone Encounter (Signed)
Attempted to called pt twice and left VM for scheduled. Consulted with PCP and reported she denied SI at Monday appt.  Royetta Asal, PhD., LMFT

## 2022-07-07 ENCOUNTER — Telehealth: Payer: Self-pay | Admitting: Psychology

## 2022-07-07 ENCOUNTER — Ambulatory Visit: Payer: BC Managed Care – PPO | Admitting: Psychology

## 2022-07-07 NOTE — Telephone Encounter (Signed)
Pt called back to reschedule appt from today.  Attempted to call pt back and LVM

## 2022-07-25 ENCOUNTER — Telehealth: Payer: Self-pay | Admitting: Family Medicine

## 2022-07-25 NOTE — Telephone Encounter (Signed)
Called and scheduled appt for 7/31 at 130.  Denied SI/HI.  Royetta Asal, PhD., LMFT

## 2022-07-25 NOTE — Telephone Encounter (Signed)
Patient dropped of FMLA forms to be completed. Last DOS was 06/30/22. Placed in [employer].

## 2022-07-25 NOTE — Telephone Encounter (Signed)
Clinical info completed on FMLA form.  Placed form in Dr. Cyndia Skeeters box for completion.    When form is completed, please route note to "RN Team" and place in wall pocket in front office.   Adelaido Nicklaus, CMA

## 2022-07-28 ENCOUNTER — Ambulatory Visit (INDEPENDENT_AMBULATORY_CARE_PROVIDER_SITE_OTHER): Payer: BC Managed Care – PPO | Admitting: Psychology

## 2022-07-28 DIAGNOSIS — F331 Major depressive disorder, recurrent, moderate: Secondary | ICD-10-CM

## 2022-07-28 NOTE — Telephone Encounter (Signed)
Form completed and placed in RN box

## 2022-07-28 NOTE — BH Specialist Note (Signed)
Integrated Behavioral Health Follow Up In-Person Visit  MRN: [id number] Name: [name]  Number of Integrated Behavioral Health Clinician visits: Additional Visit  Session Start time: 1330   Session End time: 1400  Total time in minutes: 30   Types of Service: Individual psychotherapy   Subjective: [name] is a 36 y.o. female  Patient was referred by Dr. Leveda Anna for depression. Patient reports the following symptoms/concerns: Pt reported improved mood. Shared she has been taking her medication and it has been helpful.   Reported she is not wanting to go back to work and we discussed what fear keeps her from going back to work.   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 Discussed her strengths in handling hard situations.  Reports she has negative belief about self.  Reported she feels it might stem from father's lack of involvement in childhood.   Duration of problem: more than 6 months; Severity of problem: moderate  Objective: Mood: Depressed and Affect: Tearful Risk of harm to self or others: No plan to harm self or others  Life Context: Family and Social: single mom School/Work: on FMLA returns next week  Patient and/or Family's Strengths/Protective Factors: Concrete supports in place (healthy food, safe environments, etc.)  Goals Addressed: Patient will:  Reduce symptoms of: depression: improved motivation; feels overwhelmed  Increase knowledge and/or ability of: coping skills : challenging thoughts    Progress towards Goals: Ongoing  Interventions: Interventions utilized:  CBT Cognitive Behavioral Therapy, Medication Monitoring, and Supportive Counseling Standardized Assessments completed: Not Needed  Patient and/or Family Response: pt engaged in treatment plan  Patient Centered Plan: Patient is on the following Treatment Plan(s): depressed mood treatment plan Assessment: Patient currently experiencing  depression due to life circumstances.   Patient may benefit from CBT.  Plan: Follow up with behavioral health clinician on : 2 weeks  Behavioral recommendations: challenging beliefs of self  Referral(s): Integrated [employer] (In Clinic)  Royetta Asal, PhD., LMFT

## 2022-07-29 NOTE — Telephone Encounter (Signed)
Paperwork faxed to provided number at [phone]. Copy made and placed in batch scanning.   Veronda Prude, RN

## 2022-07-31 ENCOUNTER — Telehealth: Payer: Self-pay | Admitting: Psychology

## 2022-07-31 NOTE — Telephone Encounter (Signed)
Pt called due to issues with disability paperwork.  Her work has asked a Warden/ranger or psychiatrist to fill out this paperwork.  She reported that these providers do not feel comfortable filling out this paperwork due to she has established therapist Hydrographic surveyor) and Dr. Leveda Anna.  Recommended she call work and ask what they suggest.   Reported had a low day yesterday with passsive SI.  Shared she went to sleep to cope. Reports she feels comfortable continuing to fight them.    Inquired if Dr. Leveda Anna would write her out for another month.  Requested she make an appt with him to discuss.   Royetta Asal, PhD., LMFT

## 2022-07-31 NOTE — Telephone Encounter (Signed)
Noted and agree. 

## 2022-08-01 ENCOUNTER — Telehealth: Payer: Self-pay | Admitting: Psychology

## 2022-08-01 NOTE — Telephone Encounter (Signed)
FMLA info faxed per patient's request.  Release in chart.

## 2022-08-11 ENCOUNTER — Telehealth: Payer: Self-pay | Admitting: Psychology

## 2022-08-11 ENCOUNTER — Ambulatory Visit: Payer: BC Managed Care – PPO | Admitting: Family Medicine

## 2022-08-11 ENCOUNTER — Ambulatory Visit: Payer: BC Managed Care – PPO | Admitting: Psychology

## 2022-08-11 NOTE — Telephone Encounter (Signed)
Rescheduled appt.  Rescheduled for next Monday. Reported forgot appt.  Pt reported their mood ok.  Denied SI.   Royetta Asal, PhD., LMFT

## 2022-08-18 ENCOUNTER — Ambulatory Visit: Payer: BC Managed Care – PPO | Admitting: Psychology

## 2022-08-25 ENCOUNTER — Ambulatory Visit (INDEPENDENT_AMBULATORY_CARE_PROVIDER_SITE_OTHER): Payer: BC Managed Care – PPO | Admitting: Family Medicine

## 2022-08-25 ENCOUNTER — Telehealth: Payer: Self-pay

## 2022-08-25 ENCOUNTER — Ambulatory Visit (INDEPENDENT_AMBULATORY_CARE_PROVIDER_SITE_OTHER): Payer: BC Managed Care – PPO | Admitting: Psychology

## 2022-08-25 ENCOUNTER — Encounter: Payer: Self-pay | Admitting: Family Medicine

## 2022-08-25 DIAGNOSIS — F331 Major depressive disorder, recurrent, moderate: Secondary | ICD-10-CM | POA: Diagnosis not present

## 2022-08-25 MED ORDER — VENLAFAXINE HCL ER 37.5 MG PO CP24: 37.5000 mg | ORAL_CAPSULE | Freq: Every day | ORAL | 1 refills | Status: DC

## 2022-08-25 NOTE — BH Specialist Note (Signed)
Integrated Behavioral Health Follow Up In-Person Visit  MRN: [id number] Name: [name]  Number of Integrated Behavioral Health Clinician visits: Additional Visit  Session Start time: 1025   Session End time: 1053  Total time in minutes: 28   Types of Service: Individual psychotherapy   Subjective: [name] is a 36 y.o. female  Patient was referred by Dr. Leveda Anna for depression. Patient reports the following symptoms/concerns: Pt reported decreased mood. Shared she feels stuck and overwhelmed by depression sx. Reports has a hard time going to work. Shares a lot of her depression stems from missing her mom.  Reported passive SI with son as protective factor. Shared she really struggles with her mom not being alive.  Reports has not been back to work.  Discussed what about work is overwhelming. Talked about engaging in making small changes to help with mood.  Encouraged pt to talk about medication management with Dr. Leveda Anna  Discussed maternity leave for provider  Duration of problem: more than 6 months; Severity of problem: moderate  Objective: Mood: Depressed and Affect: Tearful Risk of harm to self or others: Suicidal ideation: passive thoughts of not wanting to be here; denied plan and intent; feels comfortable reaching out for help if worsens  Life Context: Family and Social: single mom School/Work: FMLA  Patient and/or Family's Strengths/Protective Factors: Concrete supports in place (healthy food, safe environments, etc.)  Goals Addressed: Patient will:  Reduce symptoms of: depression : passive SI; feeling overwhelmed and stuck; tearful  Increase knowledge and/or ability of: coping skills : recognizing feelings; challenging thoughts; thinking about small change    Progress towards Goals: Ongoing  Interventions: Interventions utilized:  Motivational Interviewing and Supportive Counseling Standardized Assessments completed: Not Needed  Patient  and/or Family Response: engaged in treatment plan  Patient Centered Plan: Patient is on the following Treatment Plan(s): depression treatment plan Assessment: Patient currently experiencing depression.   Patient may benefit from CBT and medication management.  Plan: Follow up with behavioral health clinician on : 1 week Behavioral recommendations: find small changes to make Referral(s): Integrated [employer] (In Clinic)  Royetta Asal, PhD., LMFT

## 2022-08-25 NOTE — Patient Instructions (Signed)
I will slowly switch you from prozac to a new drug called effexor.   Stay on the seroquel Week #1 Take on effexor daily and take prozac 20 mg dialy Week #2 Take two effexors daily and take prozac 20 mg every other day. See me in 2-3 weeks and we will keep at it until you are seen by the psychiatrist.  If we get incredibly lucky and the effexor turns out to be a great medicine for you, we can cancel the psychiatrist.   I will fill out the paperwork and call you when it is ready.

## 2022-08-25 NOTE — Telephone Encounter (Signed)
Patient calls nurse line due to issues with picking up Venlafaxine prescription.   Called pharmacy. There was an error in the quantity input on the pharmacy end. Pharmacist updated the quantity and the medication went through for $0.   Attempted to call patient back to provide with updated. Phone went straight to VM, mailbox was full and I was unable to LVM. If patient calls back please advise that medication issues are resolved.   Veronda Prude, RN

## 2022-08-26 ENCOUNTER — Encounter: Payer: Self-pay | Admitting: Family Medicine

## 2022-08-26 NOTE — Progress Notes (Signed)
    SUBJECTIVE:   CHIEF COMPLAINT / HPI:   Although Ms Scaff asked for an annual exam when scheduling, this quickly turned into a depression follow up, not an annual exam. Kashira tells me, "I am not doing so good."  Main complaint is being tired all the time.  Last visit with me 06/30/22.  Was not doing well and we pushed back her return to work date.  She was supposed to follow up in 4 weeks with me.  It has been 8 weeks.  She has not returned to work.    Last visit, I added seroquel low dose to augment her prozac and to treat the symptom of interupted, poor sleep.  She states that she is sleeping 8-10 hours per night.  Despite this adaquate amount of sleep, she still complains of severe tiredness.  She tells me that her perception is that she is no better despite months of counseling and medications.  (Depression began 05/12/22 by my notes - 3+ month duration.)  Integrated health visit confirms a high symptom burden.  Has passive thoughts of suicide.  She would not follow through because of her son.     OBJECTIVE:   BP 123/72   Pulse 69   Ht 5\' 6"  (1.676 m)   Wt 204 lb (92.5 kg)   LMP 07/29/2022 (Exact Date)   SpO2 100%   BMI 32.93 kg/m   Cries easily in office. Affect depressed throughout   ASSESSMENT/PLAN:   Major depression, recurrent (HCC) Has responded poorly to my treatments.  Given current three month duration, time to refer her to psychiatry.  Unfortunately, psychiatry has a long wait time in our community.  Therefore I will start switch from prozac to effexor.  See AVS from wean/ramp up plan.  Continue seroquel.  Short term follow up by me 2-3 weeks.  Continue counseling.  I will turn over care and medication prescribing once sees psychiatry.  She also brings in paperwork from work asking for me to complete.     09/28/2022, MD [facility]

## 2022-08-26 NOTE — Assessment & Plan Note (Addendum)
Has responded poorly to my treatments.  Given current three month duration, time to refer her to psychiatry.  Unfortunately, psychiatry has a long wait time in our community.  Therefore I will start switch from prozac to effexor.  See AVS from wean/ramp up plan.  Continue seroquel.  Short term follow up by me 2-3 weeks.  Continue counseling.  I will turn over care and medication prescribing once sees psychiatry.  She also brings in paperwork from work asking for me to complete.

## 2022-09-02 ENCOUNTER — Ambulatory Visit: Payer: BC Managed Care – PPO | Admitting: Psychology

## 2022-09-05 ENCOUNTER — Telehealth: Payer: Self-pay | Admitting: Psychology

## 2022-09-05 NOTE — Telephone Encounter (Signed)
Called to check in due to missed appt. LVM.  F/u appt with Dr. Leveda Anna next week.   Royetta Asal, PhD., LMFT

## 2022-09-10 ENCOUNTER — Ambulatory Visit (INDEPENDENT_AMBULATORY_CARE_PROVIDER_SITE_OTHER): Payer: BC Managed Care – PPO | Admitting: Family Medicine

## 2022-09-10 ENCOUNTER — Encounter: Payer: Self-pay | Admitting: Family Medicine

## 2022-09-10 DIAGNOSIS — N920 Excessive and frequent menstruation with regular cycle: Secondary | ICD-10-CM

## 2022-09-10 DIAGNOSIS — D509 Iron deficiency anemia, unspecified: Secondary | ICD-10-CM

## 2022-09-10 DIAGNOSIS — F331 Major depressive disorder, recurrent, moderate: Secondary | ICD-10-CM

## 2022-09-10 MED ORDER — VENLAFAXINE HCL ER 37.5 MG PO CP24: 37.5000 mg | ORAL_CAPSULE | Freq: Every day | ORAL | 1 refills | Status: DC

## 2022-09-10 MED ORDER — VENLAFAXINE HCL ER 75 MG PO CP24: 75.0000 mg | ORAL_CAPSULE | Freq: Every day | ORAL | 1 refills | Status: DC

## 2022-09-10 NOTE — Assessment & Plan Note (Addendum)
Seems slightly improved.  Via shared decision making, we decided to increase her effexor.  FU one month.  I encouraged her to return to work.  Also check TSH: hypothyroidism could casue some of sx.

## 2022-09-10 NOTE — Patient Instructions (Signed)
I will call with the blood test results.  I am checking for low thyroid and iron deficiency from your heavy periods.  The medical term for craving something unusual is pica. Google that word if you want to know more. I sent in a prescription for 75 mg dose of the venlafaxine.  Take one 75 and one 37.5 every morning.  See me in one month.  I may increae you to the 150 mg pill of venlafaxine.   I recommend that you try getting back to work.

## 2022-09-10 NOTE — Assessment & Plan Note (Signed)
And pica.  Check for iron deficiency.

## 2022-09-10 NOTE — Progress Notes (Signed)
    SUBJECTIVE:   CHIEF COMPLAINT / HPI:   FU depression.  "About the same."  Sleeping an appropriate amount.  Fully transitioned from prozac to effexor.  Current dose= 75 mg.  Mood may be a little improved.  Willing to try going back to work. Still fatigued.  Mentions heavy periods and new craving to chew ice.  Depression seemed obvious cause of fatigue, but I have not done screening medical WU for anemia or hypothyroidism.      OBJECTIVE:   BP 106/68   Pulse 91   Ht 5\' 6"  (1.676 m)   Wt 205 lb 9.6 oz (93.3 kg)   LMP 08/30/2022 (Exact Date)   SpO2 99%   BMI 33.18 kg/m   More engaged in the office.  Affect seems brighter. No obvious skin pallor. No thyromegally.  ASSESSMENT/PLAN:   Menorrhagia And pica.  Check for iron deficiency.  Major depression, recurrent (HCC) Seems slightly improved.  Via shared decision making, we decided to increase her effexor.  FU one month.  I encouraged her to return to work.  Also check TSH: hypothyroidism could casue some of sx.       10/30/2022, MD [facility]

## 2022-09-11 ENCOUNTER — Encounter: Payer: Self-pay | Admitting: Family Medicine

## 2022-09-11 DIAGNOSIS — D509 Iron deficiency anemia, unspecified: Secondary | ICD-10-CM | POA: Insufficient documentation

## 2022-09-11 LAB — CBC
Hematocrit: 30.3 % — ABNORMAL LOW (ref 34.0–46.6)
Hemoglobin: 8.5 g/dL — ABNORMAL LOW (ref 11.1–15.9)
MCH: 20.6 pg — ABNORMAL LOW (ref 26.6–33.0)
MCHC: 28.1 g/dL — ABNORMAL LOW (ref 31.5–35.7)
MCV: 73 fL — ABNORMAL LOW (ref 79–97)
Platelets: 448 10*3/uL (ref 150–450)
RBC: 4.13 x10E6/uL (ref 3.77–5.28)
RDW: 18.6 % — ABNORMAL HIGH (ref 11.7–15.4)
WBC: 3.8 10*3/uL (ref 3.4–10.8)

## 2022-09-11 LAB — FERRITIN: Ferritin: 7 ng/mL — ABNORMAL LOW (ref 15–150)

## 2022-09-11 LAB — TSH: TSH: 0.474 u[IU]/mL (ref 0.450–4.500)

## 2022-09-11 MED ORDER — FERROUS SULFATE 325 (65 FE) MG PO TABS: 325.0000 mg | ORAL_TABLET | Freq: Every day | ORAL | 3 refills | Status: DC

## 2022-09-11 NOTE — Addendum Note (Signed)
Addended by: Moses Manners on: 09/11/2022 03:16 PM   Modules accepted: Orders

## 2022-10-13 ENCOUNTER — Ambulatory Visit: Payer: BC Managed Care – PPO | Admitting: Family Medicine

## 2023-02-03 ENCOUNTER — Encounter: Payer: Self-pay | Admitting: Family Medicine
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
## 2024-01-06 ENCOUNTER — Ambulatory Visit (HOSPITAL_COMMUNITY)
Admission: EM | Admit: 2024-01-06 | Discharge: 2024-01-06 | Disposition: A | Discharge status: Home / Self Care | Payer: BC Managed Care – PPO | Attending: Neurology | Admitting: Neurology

## 2024-01-06 ENCOUNTER — Encounter (HOSPITAL_COMMUNITY): Payer: Self-pay

## 2024-01-06 DIAGNOSIS — R0789 Other chest pain: Secondary | ICD-10-CM

## 2024-01-06 NOTE — ED Triage Notes (Signed)
 Pt c/o center chest discomfort intermittent for the past 2 months. Denies any other sx's. Denies taking any meds for sx's.

## 2024-01-06 NOTE — Discharge Instructions (Addendum)
 It is unclear what is causing your chest discomfort and your evaluation has shown no signs of medical conditions requiring emergent intervention at this time. This is very reassuring! Your discomfort may be related to pain in the muscles and bones in your chest.   You may take tylenol  and/or ibuprofen  every 6 hours as needed for chest discomfort.  Apply heat to the chest wall 20 minutes on 20 minutes off and perform gentle stretches to the area.  If your symptoms do not improve in the next 2-3 days with interventions, please return.. Return to the Emergency Department if you experience worsening or uncontrolled chest pain, shortness of breath, light headedness, feeling faint, nausea, vomiting, or any other concerning symptoms. Otherwise, schedule a follow-up appointment with your PCP for follow-up.

## 2024-01-06 NOTE — ED Provider Notes (Signed)
 [facility]    CSN: [id number] Arrival date & time: 01/06/24  1431      History   Chief Complaint Chief Complaint  Patient presents with   Chest Pain    HPI [name] is a 38 y.o. female.   Presents reporting 2 months chest pain that has gotten worse over the last 2 months and now rating it a 6-7/10.  She states that it worsens with deep breathing, cough, laughing and has become more severe over the last month and more frequent.  She describes it as both sharp pain and dull. No recent illness, no trauma, does have a general family history of cardiac disease in her mother and maternal grandmother.  She states it does not get worse with eating and the pain is not worse at night or when she is laying down.  She denies dizziness, syncope, fatigue, or headache. She denies pain radiating to her shoulder or jaw.   The history is provided by the patient.  Chest Pain Pain location:  Substernal area Pain quality: aching, dull and sharp   Pain radiates to:  Does not radiate Pain severity:  Moderate Timing:  Sporadic Progression:  Waxing and waning Chronicity:  Recurrent Context: lifting, movement and at rest   Relieved by:  None tried Worsened by:  Coughing, deep breathing and certain positions Ineffective treatments:  None tried   Past Medical History:  Diagnosis Date   CIN III (cervical intraepithelial neoplasia III)    History of chlamydia 05/2007   History of PID 05/2007   History of trichomonal vaginitis 05/2014   HPV in female    Quit smoking 09/15/2009   Qualifier: Diagnosis of  By: Manford Longs      Patient Active Problem List   Diagnosis Date Noted   Iron deficiency anemia 09/11/2022   Menorrhagia 09/10/2022   Major depression, recurrent (HCC) 08/28/2021   Routine screening for STI (sexually transmitted infection) 10/05/2020   Chest pain 09/27/2020   Adult acne 09/27/2020   Carcinoma in situ of endocervix 05/27/2017   Encounter for annual physical  exam 10/30/2016   Pap smear of cervix to confirm normal smear following abnormal smear 06/02/2014   Contraceptive management 06/02/2014   MIGRAINE, UNSPEC., W/O INTRACTABLE MIGRAINE 02/25/2007    Past Surgical History:  Procedure Laterality Date   CESAREAN SECTION  01/30/2005   LEEP N/A 06/16/2017   Procedure: LOOP ELECTROSURGICAL EXCISION PROCEDURE (LEEP);  Surgeon: Eloy Herring, MD;  Location: [facility];  Service: Gynecology;  Laterality: N/A;   WRIST GANGLION EXCISION Left 02/09/2002   by arthroscopically    OB History     Gravida  1   Para      Term      Preterm      AB      Living         SAB      IAB      Ectopic      Multiple      Live Births               Home Medications    Prior to Admission medications   Medication Sig Start Date End Date Taking? Authorizing Provider  medroxyPROGESTERone  (PROVERA ) 10 MG tablet Take 1 tablet (10 mg total) by mouth daily. Patient not taking: Reported on 05/01/2015 01/30/15 08/14/15  Sonda Alm BROCKS, MD  metoCLOPramide  (REGLAN ) 10 MG tablet 1 every 12 hours as needed for migraine headache. Patient not taking: Reported  on 05/01/2015 01/30/15 08/14/15  Sonda Alm BROCKS, MD    Family History History reviewed. No pertinent family history.  Social History Social History   Tobacco Use   Smoking status: Every Day    Current packs/day: 0.50    Average packs/day: 0.5 packs/day for 3.4 years (1.7 ttl pk-yrs)    Types: Cigarettes    Start date: 08/28/2020   Smokeless tobacco: Never  Substance Use Topics   Alcohol use: No   Drug use: No     Allergies   Patient has no known allergies.   Review of Systems Review of Systems  Cardiovascular:  Positive for chest pain.     Physical Exam Triage Vital Signs ED Triage Vitals  Encounter Vitals Group     BP 01/06/24 1611 (!) 129/90     Systolic BP Percentile --      Diastolic BP Percentile --      Pulse Rate 01/06/24 1611 (!) 58     Resp 01/06/24 1611  18     Temp 01/06/24 1611 98.3 F (36.8 C)     Temp Source 01/06/24 1611 Oral     SpO2 01/06/24 1611 98 %     Weight --      Height --      Head Circumference --      Peak Flow --      Pain Score 01/06/24 1612 8     Pain Loc --      Pain Education --      Exclude from Growth Chart --    No data found.  Updated Vital Signs BP (!) 129/90 (BP Location: Left Arm)   Pulse (!) 58   Temp 98.3 F (36.8 C) (Oral)   Resp 18   LMP 12/30/2023 (Exact Date)   SpO2 98%   Breastfeeding No   Visual Acuity Right Eye Distance:   Left Eye Distance:   Bilateral Distance:    Right Eye Near:   Left Eye Near:    Bilateral Near:     Physical Exam Constitutional:      Appearance: She is well-developed.  HENT:     Head: Normocephalic.  Eyes:     Extraocular Movements: Extraocular movements intact.     Pupils: Pupils are equal, round, and reactive to light.  Cardiovascular:     Rate and Rhythm: Normal rate and regular rhythm.     Heart sounds: Murmur heard.  Pulmonary:     Effort: Pulmonary effort is normal.     Breath sounds: Normal breath sounds.  Abdominal:     Palpations: Abdomen is soft.  Musculoskeletal:     Cervical back: Normal range of motion.  Skin:    General: Skin is warm and dry.     Capillary Refill: Capillary refill takes less than 2 seconds.  Neurological:     Mental Status: She is alert and oriented to person, place, and time.      UC Treatments / Results  Labs (all labs ordered are listed, but only abnormal results are displayed) Labs Reviewed - No data to display  EKG   Radiology No results found.  Procedures Procedures (including critical care time)  Medications Ordered in UC Medications - No data to display  Initial Impression / Assessment and Plan / UC Course  I have reviewed the triage vital signs and the nursing notes.  Emergent cause to patient's chest pain ruled out today based on EKG, history, and physical exam.  Pericarditis, chest  wall tenderness,  acute MI, infectious process, acid reflux, and PE considered. Patient is low risk for acute coronary event based on risk factors.   Deferred imaging given clear cardiopulmonary exam, hemodynamically stable vital signs.  EKG shows normal sinus rhythm without any ST/T wave changes and normal intervals.No previous EKG for comparison.  Heart score: 1  Will manage this as a chest wall strain with ibuprofen  and tylenol , heat/gentle ROM exercises) to treat for musculoskeletal etiology. Denies need for muscle relaxer. Recommend follow-up with PCP in 3-5 days and ER precautions for new or worsening symptoms.   Pertinent labs & imaging results that were available during my care of the patient were reviewed by me and considered in my medical decision making (see chart for details).         Final Clinical Impressions(s) / UC Diagnoses   Final diagnoses:  None     Discharge Instructions      It is unclear what is causing your chest discomfort and your evaluation has shown no signs of medical conditions requiring emergent intervention at this time. This is very reassuring! Your discomfort may be related to pain in the muscles and bones in your chest.   You may take tylenol  and/or ibuprofen  every 6 hours as needed for chest discomfort.  Apply heat to the chest wall 20 minutes on 20 minutes off and perform gentle stretches to the area. You may take muscle relaxer as prescribed but do not drink alcohol or drive while taking this since it can make you sleepy. Mainly take this at nighttime to help with symptoms.  If your symptoms do not improve in the next 2-3 days with interventions, please return.. Return to the Emergency Department if you experience worsening or uncontrolled chest pain, shortness of breath, light headedness, feeling faint, nausea, vomiting, or any other concerning symptoms. Otherwise, schedule a follow-up appointment with your PCP for follow-up.      ED  Prescriptions   None    PDMP not reviewed this encounter.   Remi Pippin, NP 01/06/24 1724
